# Patient Record
Sex: Female | Born: 1990 | Hispanic: No | Marital: Single | Smoking: Current every day smoker
Health system: Southern US, Community
[De-identification: ages and names within clinical notes are randomized; demographics above are authoritative.]

## PROBLEM LIST (undated history)

## (undated) ENCOUNTER — Inpatient Hospital Stay (HOSPITAL_COMMUNITY): Payer: Self-pay

## (undated) DIAGNOSIS — R011 Cardiac murmur, unspecified: Secondary | ICD-10-CM

## (undated) DIAGNOSIS — A599 Trichomoniasis, unspecified: Secondary | ICD-10-CM

## (undated) DIAGNOSIS — Z8619 Personal history of other infectious and parasitic diseases: Secondary | ICD-10-CM

## (undated) DIAGNOSIS — A539 Syphilis, unspecified: Secondary | ICD-10-CM

## (undated) DIAGNOSIS — R51 Headache: Secondary | ICD-10-CM

## (undated) HISTORY — PX: WISDOM TOOTH EXTRACTION: SHX21

## (undated) HISTORY — PX: ROOT CANAL: SHX2363

## (undated) HISTORY — DX: Personal history of other infectious and parasitic diseases: Z86.19

---
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
## 2012-01-16 ENCOUNTER — Emergency Department (HOSPITAL_COMMUNITY): Payer: Medicaid Other

## 2012-01-16 ENCOUNTER — Emergency Department (HOSPITAL_COMMUNITY)
Admission: EM | Admit: 2012-01-16 | Discharge: 2012-01-16 | Disposition: A | Discharge status: Home / Self Care | Payer: Medicaid Other | Attending: Emergency Medicine | Admitting: Emergency Medicine

## 2012-01-16 ENCOUNTER — Encounter (HOSPITAL_COMMUNITY): Payer: Self-pay | Admitting: Emergency Medicine

## 2012-01-16 DIAGNOSIS — M79609 Pain in unspecified limb: Secondary | ICD-10-CM | POA: Insufficient documentation

## 2012-01-16 DIAGNOSIS — L600 Ingrowing nail: Secondary | ICD-10-CM | POA: Insufficient documentation

## 2012-01-16 DIAGNOSIS — F17200 Nicotine dependence, unspecified, uncomplicated: Secondary | ICD-10-CM | POA: Insufficient documentation

## 2012-01-16 MED ORDER — SULFAMETHOXAZOLE-TRIMETHOPRIM 800-160 MG PO TABS: 1.0000 | ORAL_TABLET | Freq: Two times a day (BID) | ORAL | Status: AC

## 2012-01-16 MED ORDER — LIDOCAINE HCL 2 % IJ SOLN
5.0000 mL | Freq: Once | INTRAMUSCULAR | Status: AC
  Administered 2012-01-16: 100 mg via INTRADERMAL
  Filled 2012-01-16 (×2): qty 1

## 2012-01-16 NOTE — ED Notes (Signed)
Onset of painful right great toe 3 days ago w/ yellowish drainage, last noc noticed same discomfort left great toe w/ minimal drainage. 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

## 2012-01-16 NOTE — ED Provider Notes (Addendum)
History     CSN: [id number]  Arrival date & time 01/16/12  1526   First MD Initiated Contact with Patient 01/16/12 1703      Chief Complaint  Patient presents with  . Toe Pain    bilateral ingrown toenails, both great toes    (Consider location/radiation/quality/duration/timing/severity/associated sxs/prior treatment) HPI Comments: Pt to ED w ingrown toenails bilaterally. Reports purulent dc from left great toe. Denies fevers, NS, chills. Pain severity 6/10.   Patient is a 21 y.o. female presenting with toe pain. The history is provided by the patient.  Toe Pain This is a new problem. Episode onset: 3 days ago. The problem occurs constantly. The problem has been gradually worsening. Pertinent negatives include no abdominal pain, arthralgias, change in bowel habit, chest pain, chills, congestion, coughing, diaphoresis, fever, joint swelling, myalgias, nausea, neck pain, numbness or rash. Exacerbated by: pressure. She has tried nothing for the symptoms.    Past Medical History  Diagnosis Date  . Headache   . Heart murmur   . Trichimoniasis   . H/O chlamydia infection     Past Surgical History  Procedure Date  . Root canal     Family History  Problem Relation Age of Onset  . Asthma Mother   . Hypertension Mother   . Asthma Father   . Heart disease Father   . Hypertension Father   . Hyperlipidemia Father   . Anesthesia problems Neg Hx   . Hypotension Neg Hx   . Malignant hyperthermia Neg Hx   . Pseudochol deficiency Neg Hx   . Other Neg Hx     History  Substance Use Topics  . Smoking status: Current Everyday Smoker -- 0.2 packs/day for 6 years    Types: Cigarettes  . Smokeless tobacco: Never Used  . Alcohol Use: No    OB History    Grav Para Term Preterm Abortions TAB SAB Ect Mult Living   1 1 1  0 0 0 0 0 0 1      Review of Systems  Constitutional: Negative for fever, chills and diaphoresis.  HENT: Negative for congestion and neck pain.   Respiratory:  Negative for cough.   Cardiovascular: Negative for chest pain.  Gastrointestinal: Negative for nausea, abdominal pain and change in bowel habit.  Musculoskeletal: Negative for myalgias, joint swelling and arthralgias.  Skin: Negative for rash.  Neurological: Negative for numbness.    Allergies  Review of patient's allergies indicates no known allergies.  Home Medications  No current outpatient prescriptions on file.  BP 114/64  Pulse 80  Temp 98.1 F (36.7 C) (Oral)  Resp 16  Ht 5\' 3"  (1.6 m)  SpO2 99%  Breastfeeding? No  Physical Exam  Nursing note and vitals reviewed. Constitutional: She is oriented to person, place, and time. She appears well-developed and well-nourished. No distress.  HENT:  Head: Normocephalic and atraumatic.  Eyes: Conjunctivae and EOM are normal.  Neck: Normal range of motion.  Pulmonary/Chest: Effort normal.  Musculoskeletal: Normal range of motion.  Neurological: She is alert and oriented to person, place, and time.  Skin: Skin is warm and dry. No rash noted. She is not diaphoretic.       Mild purulent drainage from left great toe at medial sd w erythema and tenderness. Ingrown toenails bilaterally w ttp.   Psychiatric: She has a normal mood and affect. Her behavior is normal.    ED Course  NAIL REMOVAL Date/Time: 01/16/2012 6:15 PM Performed by: Jaci Carrel Authorized by:  Major [place]ter[place] Consent: Verbal consent obtained. Risks and benefits: risks, benefits and alternatives were discussed Patient identity confirmed: verbally with patient Location: left foot Location details: left big toe Anesthesia: digital block Local anesthetic: lidocaine 2% without epinephrine Anesthetic total: 2 ml Patient sedated: no Preparation: skin prepped with alcohol Amount removed: partial Side: radial and ulnar Nail bed sutured: no Dressing: gauze roll and antibiotic ointment Patient tolerance: Patient tolerated the procedure well with no immediate  complications.   (including critical care time)  Labs Reviewed - No data to display Dg Toe Great Left  01/16/2012  *RADIOLOGY REPORT*  Clinical Data: Ingrown toenail.  Infection.  LEFT GREAT TOE  Comparison: None.  Findings: Imaged bones, joints and soft tissues appear normal.  IMPRESSION: Negative study.  Original Report Authenticated By: Bernadene Bell. Maricela Curet, M.D.     No diagnosis found.  Digital block performed and left ingrown nail excised.   MDM  Ingrown toe nails bilateral  Dc instructions:  Trim nail plate diagonally to relieve incarceration in soft tissue Follow-up w/warm soaks & antibiotics for active infection (bactrim given) Advised wide shoes & proper nail-cutting; may recur--> podiatrist f-u         Jaci Carrel, PA-C 01/16/12 1907  Jaci Carrel, PA-C 03/10/12 0158

## 2012-01-17 NOTE — ED Provider Notes (Signed)
Medical screening examination/treatment/procedure(s) were performed by non-physician practitioner and as supervising physician I was immediately available for consultation/collaboration.   Kim Harvey. Oletta Lamas, MD 01/17/12 4098

## 2012-02-24 ENCOUNTER — Telehealth (HOSPITAL_COMMUNITY): Payer: Self-pay | Admitting: *Deleted

## 2012-03-10 NOTE — ED Provider Notes (Signed)
Medical screening examination/treatment/procedure(s) were performed by non-physician practitioner and as supervising physician I was immediately available for consultation/collaboration.   Monae Topping Y. Rondall Radigan, MD 03/10/12 1136 

## 2012-04-06 ENCOUNTER — Encounter (HOSPITAL_COMMUNITY): Payer: Self-pay | Admitting: *Deleted

## 2012-04-06 ENCOUNTER — Inpatient Hospital Stay (HOSPITAL_COMMUNITY)
Admission: AD | Admit: 2012-04-06 | Discharge: 2012-04-06 | Discharge status: Against Medical Advice | Payer: Medicaid Other | Source: Ambulatory Visit | Attending: Obstetrics & Gynecology | Admitting: Obstetrics & Gynecology

## 2012-04-06 DIAGNOSIS — Z3201 Encounter for pregnancy test, result positive: Secondary | ICD-10-CM | POA: Insufficient documentation

## 2012-04-06 DIAGNOSIS — R109 Unspecified abdominal pain: Secondary | ICD-10-CM | POA: Insufficient documentation

## 2012-04-06 LAB — POCT PREGNANCY, URINE: Preg Test, Ur: POSITIVE — AB

## 2012-04-06 NOTE — OB Triage Note (Signed)
2nd call from lobby, not there

## 2012-04-06 NOTE — MAU Note (Signed)
3rd time- not in lobby 

## 2012-04-06 NOTE — MAU Note (Addendum)
Last preg July 2013, irreg periods, had period in Sept? Home UPT positive, here for preg test. Last pregnancy delivered by Dr. Shanna Cisco same this time

## 2012-06-01 NOTE — L&D Delivery Note (Signed)
Delivery Note At 10:33 AM a viable female was delivered via Vaginal, Spontaneous Delivery (Presentation: ; Occiput Anterior).  APGAR: 9, 9; weight .   Placenta status: , .  Cord: 3 vessels with the following complications: None.  Cord pH: not done  Anesthesia: Epidural  Episiotomy: None Lacerations:  Suture Repair: 2.0 Est. Blood Loss (mL):   Mom to postpartum.  Baby to nursery-stable.  Lundon Rosier A 12/03/2012, 11:16 AM

## 2012-06-06 LAB — OB RESULTS CONSOLE RUBELLA ANTIBODY, IGM: Rubella: IMMUNE

## 2012-06-06 LAB — OB RESULTS CONSOLE GBS: GBS: NEGATIVE

## 2012-06-06 LAB — OB RESULTS CONSOLE ABO/RH: RH Type: POSITIVE

## 2012-06-06 LAB — OB RESULTS CONSOLE HIV ANTIBODY (ROUTINE TESTING): HIV: NONREACTIVE

## 2012-06-06 LAB — OB RESULTS CONSOLE GC/CHLAMYDIA: Chlamydia: NEGATIVE

## 2012-06-20 ENCOUNTER — Encounter (HOSPITAL_COMMUNITY): Payer: Self-pay | Admitting: *Deleted

## 2012-06-20 ENCOUNTER — Inpatient Hospital Stay (HOSPITAL_COMMUNITY)
Admission: AD | Admit: 2012-06-20 | Discharge: 2012-06-20 | Disposition: A | Discharge status: Home / Self Care | Payer: Medicaid Other | Source: Ambulatory Visit | Attending: Obstetrics | Admitting: Obstetrics

## 2012-06-20 DIAGNOSIS — R109 Unspecified abdominal pain: Secondary | ICD-10-CM | POA: Insufficient documentation

## 2012-06-20 DIAGNOSIS — A088 Other specified intestinal infections: Secondary | ICD-10-CM

## 2012-06-20 DIAGNOSIS — O210 Mild hyperemesis gravidarum: Secondary | ICD-10-CM | POA: Insufficient documentation

## 2012-06-20 DIAGNOSIS — A084 Viral intestinal infection, unspecified: Secondary | ICD-10-CM

## 2012-06-20 LAB — URINALYSIS, ROUTINE W REFLEX MICROSCOPIC
Nitrite: NEGATIVE
Protein, ur: NEGATIVE mg/dL
Specific Gravity, Urine: >1.03 — ABNORMAL HIGH (ref 1.005–1.030)
Urobilinogen, UA: 0.2 mg/dL (ref 0.0–1.0)

## 2012-06-20 LAB — URINE MICROSCOPIC-ADD ON

## 2012-06-20 MED ORDER — PROMETHAZINE HCL 25 MG/ML IJ SOLN
12.5000 mg | Freq: Once | INTRAMUSCULAR | Status: AC
  Administered 2012-06-20: 12.5 mg via INTRAVENOUS
  Filled 2012-06-20: qty 1

## 2012-06-20 MED ORDER — PROMETHAZINE HCL 25 MG PO TABS: 25.0000 mg | ORAL_TABLET | Freq: Four times a day (QID) | ORAL | Status: DC | PRN

## 2012-06-20 MED ORDER — LACTATED RINGERS IV SOLN
INTRAVENOUS | Status: DC
  Administered 2012-06-20: 22:00:00 via INTRAVENOUS

## 2012-06-20 NOTE — MAU Provider Note (Signed)
History     CSN: [id number]  Arrival date and time: 06/20/12 2016   First Provider Initiated Contact with Patient 06/20/12 2124      Chief Complaint  Patient presents with  . Morning Sickness   HPI This is a 22 y.o. female at [redacted]w[redacted]d who presents with c/o vomiting all day. States has some stomach pains that come and go, all over stomach. Denies fever or diarrhea.   RN Note: Patient complains of stomach pains since this morning. Vomiting on-going all day. Has not been able to keep anything down.       OB History    Grav Para Term Preterm Abortions TAB SAB Ect Mult Living   2 1 1  0 0 0 0 0 0 1      Past Medical History  Diagnosis Date  . Headache   . Heart murmur   . Trichimoniasis   . H/O chlamydia infection     Past Surgical History  Procedure Date  . Root canal     Family History  Problem Relation Age of Onset  . Asthma Mother   . Hypertension Mother   . Asthma Father   . Heart disease Father   . Hypertension Father   . Hyperlipidemia Father   . Anesthesia problems Neg Hx   . Hypotension Neg Hx   . Malignant hyperthermia Neg Hx   . Pseudochol deficiency Neg Hx   . Other Neg Hx     History  Substance Use Topics  . Smoking status: Current Every Day Smoker -- 0.2 packs/day for 6 years    Types: Cigarettes  . Smokeless tobacco: Never Used  . Alcohol Use: No    Allergies: No Known Allergies  Prescriptions prior to admission  Medication Sig Dispense Refill  . acetaminophen (TYLENOL) 500 MG tablet Take 1,000 mg by mouth every 6 (six) hours as needed. For pain      . Prenatal Vit-Fe Fumarate-FA (PRENATAL MULTIVITAMIN) TABS Take 1 tablet by mouth daily.        Review of Systems  Constitutional: Negative for fever, chills and malaise/fatigue.  Gastrointestinal: Positive for nausea, vomiting and abdominal pain. Negative for heartburn, diarrhea and constipation.  Genitourinary: Negative for dysuria.  Musculoskeletal: Negative for myalgias.    Neurological: Positive for weakness. Negative for dizziness and headaches.   Physical Exam   Blood pressure 97/55, pulse 107, temperature 98.9 F (37.2 C), temperature source Oral, resp. rate 18, last menstrual period 02/14/2012, unknown if currently breastfeeding.  Physical Exam  Constitutional: She is oriented to person, place, and time. She appears well-developed. No distress.  HENT:  Head: Normocephalic.  Cardiovascular: Normal rate.   Respiratory: Effort normal.  GI: Soft. She exhibits no distension and no mass. There is tenderness (diffuse, mild, no focal tenderness). There is no rebound and no guarding.  Musculoskeletal: Normal range of motion.  Neurological: She is alert and oriented to person, place, and time.  Skin: Skin is warm and dry. She is not diaphoretic.  Psychiatric: She has a normal mood and affect.   FHR 150s  MAU Course  Procedures  MDM IV hydration of LR given with dose of Phenergan IV. Patient states she feels much better after this. Would like Rx Phenergan for home use.   Assessment and Plan  A:  SIUP at [redacted]w[redacted]d       Probable viral gastroenteritis  P:  Discharge home      Rx Phenergan given      Slow progression of diet  as tolerated      Followup with prenatal care  [facility] 06/20/2012, 9:31 PM

## 2012-06-20 NOTE — MAU Note (Signed)
Patient complains of stomach pains since this morning. Vomiting on-going all day. Has not been able to keep anything down.

## 2012-06-21 ENCOUNTER — Encounter (HOSPITAL_COMMUNITY): Payer: Self-pay | Admitting: Advanced Practice Midwife

## 2012-06-21 NOTE — MAU Provider Note (Signed)
Attestation of Attending Supervision of Advanced Practitioner (CNM/NP): Evaluation and management procedures were performed by the Advanced Practitioner under my supervision and collaboration.  I have reviewed the Advanced Practitioner's note and chart, and I agree with the management and plan.  HARRAWAY-SMITH, Maygan Koeller 6:34 AM

## 2012-06-22 LAB — URINE CULTURE
Colony Count: NO GROWTH
Culture: NO GROWTH

## 2012-10-05 ENCOUNTER — Inpatient Hospital Stay (HOSPITAL_COMMUNITY)
Admission: AD | Admit: 2012-10-05 | Discharge: 2012-10-05 | Disposition: A | Discharge status: Home / Self Care | Payer: Medicaid Other | Source: Ambulatory Visit | Attending: Obstetrics | Admitting: Obstetrics

## 2012-10-05 ENCOUNTER — Encounter (HOSPITAL_COMMUNITY): Payer: Self-pay

## 2012-10-05 DIAGNOSIS — R109 Unspecified abdominal pain: Secondary | ICD-10-CM

## 2012-10-05 DIAGNOSIS — N859 Noninflammatory disorder of uterus, unspecified: Secondary | ICD-10-CM

## 2012-10-05 DIAGNOSIS — N949 Unspecified condition associated with female genital organs and menstrual cycle: Secondary | ICD-10-CM

## 2012-10-05 DIAGNOSIS — O99891 Other specified diseases and conditions complicating pregnancy: Secondary | ICD-10-CM

## 2012-10-05 LAB — URINALYSIS, ROUTINE W REFLEX MICROSCOPIC
Bilirubin Urine: NEGATIVE
Nitrite: NEGATIVE
Protein, ur: NEGATIVE mg/dL
Specific Gravity, Urine: 1.01 (ref 1.005–1.030)
Urobilinogen, UA: 0.2 mg/dL (ref 0.0–1.0)

## 2012-10-05 MED ORDER — TERBUTALINE SULFATE 1 MG/ML IJ SOLN
0.2500 mg | Freq: Once | INTRAMUSCULAR | Status: AC
  Administered 2012-10-05: 0.25 mg via SUBCUTANEOUS
  Filled 2012-10-05: qty 1

## 2012-10-05 NOTE — MAU Provider Note (Signed)
History     CSN: [id number]  Arrival date and time: 10/05/12 1308   First Provider Initiated Contact with Patient 10/05/12 [phone]      Chief Complaint  Patient presents with  . Abdominal Pain   HPI This is a 22 y.o. female at [redacted]w[redacted]d who presents with c/o sharp pain in pubic bone for 2 months. States was up playing cards and it hurt worse.  Has some pelvic cramping also but mainly hurts in pubic bone, especially when walking.  RN Note: Patient states she has been having sharp pain in the lower abdomen and vagina for the last 2 months. The pain has gotten worse tonight. Denies vaginal bleeding and leaking of fluid. Good fetal movement.       OB History   Grav Para Term Preterm Abortions TAB SAB Ect Mult Living   2 1 1  0 0 0 0 0 0 1      Past Medical History  Diagnosis Date  . Headache   . Heart murmur   . Trichimoniasis   . H/O chlamydia infection     Past Surgical History  Procedure Laterality Date  . Root canal      Family History  Problem Relation Age of Onset  . Asthma Mother   . Hypertension Mother   . Asthma Father   . Heart disease Father   . Hypertension Father   . Hyperlipidemia Father   . Anesthesia problems Neg Hx   . Hypotension Neg Hx   . Malignant hyperthermia Neg Hx   . Pseudochol deficiency Neg Hx   . Other Neg Hx     History  Substance Use Topics  . Smoking status: Current Every Day Smoker -- 0.25 packs/day for 6 years    Types: Cigarettes  . Smokeless tobacco: Never Used  . Alcohol Use: No    Allergies: No Known Allergies  Prescriptions prior to admission  Medication Sig Dispense Refill  . acetaminophen (TYLENOL) 500 MG tablet Take 1,000 mg by mouth every 6 (six) hours as needed. For pain      . Prenatal Vit-Fe Fumarate-FA (PRENATAL MULTIVITAMIN) TABS Take 1 tablet by mouth daily.      . promethazine (PHENERGAN) 25 MG tablet Take 1 tablet (25 mg total) by mouth every 6 (six) hours as needed for nausea.  30 tablet  0    Review of  Systems  Constitutional: Negative for fever and malaise/fatigue.  Gastrointestinal: Negative for nausea, vomiting, abdominal pain, diarrhea and constipation.  Genitourinary:       Pain over suprapubic area  Neurological: Negative for weakness and headaches.   Physical Exam   Blood pressure 122/51, pulse 105, temperature 98.2 F (36.8 C), temperature source Oral, resp. rate 20, height 5\' 4"  (1.626 m), weight 158 lb (71.668 kg), last menstrual period 02/14/2012, SpO2 98.00%.  Physical Exam  Constitutional: She is oriented to person, place, and time. She appears well-developed and well-nourished. No distress.  Cardiovascular: Normal rate.   Respiratory: Effort normal.  GI: Soft. She exhibits no distension and no mass. There is tenderness (over suprapubic bone). There is no rebound and no guarding.  Genitourinary: Vagina normal and uterus normal. No vaginal discharge found.  Cervix long and closed  Musculoskeletal: Normal range of motion.  Neurological: She is alert and oriented to person, place, and time.  Skin: Skin is warm and dry.  Psychiatric: She has a normal mood and affect.   Fetal heart rate reactive Uterine irritability MAU Course  Procedures  MDM  Gave one dose of Terbutaline with resolution of some of her pain and irritability. Still has some pain in pubic bone when moving.  Assessment and Plan  A:  SIUP at [redacted]w[redacted]d      Uterine irritability, resolved      Symphysis pubis pain  P;  Discharge home       PTL precautions       Move slowly, comfort measures for SP pain       Follow up with Dr Raynelle Jan 10/05/2012, 3:38 AM

## 2012-10-05 NOTE — MAU Note (Signed)
Patient states she has been having sharp pain in the lower abdomen and vagina for the last 2 months. The pain has gotten worse tonight. Denies vaginal bleeding and leaking of fluid. Good fetal movement.

## 2012-11-16 ENCOUNTER — Encounter (HOSPITAL_COMMUNITY): Payer: Self-pay

## 2012-11-16 ENCOUNTER — Inpatient Hospital Stay (HOSPITAL_COMMUNITY)
Admission: AD | Admit: 2012-11-16 | Discharge: 2012-11-16 | Disposition: A | Discharge status: Home / Self Care | Payer: Medicaid Other | Source: Ambulatory Visit | Attending: Obstetrics | Admitting: Obstetrics

## 2012-11-16 DIAGNOSIS — O479 False labor, unspecified: Secondary | ICD-10-CM | POA: Insufficient documentation

## 2012-11-16 DIAGNOSIS — O212 Late vomiting of pregnancy: Secondary | ICD-10-CM | POA: Insufficient documentation

## 2012-11-16 DIAGNOSIS — R109 Unspecified abdominal pain: Secondary | ICD-10-CM | POA: Insufficient documentation

## 2012-11-16 LAB — URINALYSIS, ROUTINE W REFLEX MICROSCOPIC
Bilirubin Urine: NEGATIVE
Glucose, UA: NEGATIVE mg/dL
Hgb urine dipstick: NEGATIVE
Specific Gravity, Urine: 1.01 (ref 1.005–1.030)

## 2012-11-16 LAB — URINE MICROSCOPIC-ADD ON

## 2012-11-16 NOTE — MAU Note (Signed)
Pt states some abdominal pain and cramping since 11pm. Had some nausea and threw up x2. Denies vaginal bleeding and LOF. States good FM.

## 2012-11-16 NOTE — MAU Note (Signed)
Contractions, ?leaking fluid since 1700

## 2012-11-16 NOTE — MAU Note (Signed)
Pt presents with report of abd cramping, vomiting x 2 hours. Also reports increased fetal movement.

## 2012-11-17 LAB — URINE CULTURE: Culture: NO GROWTH

## 2012-11-18 ENCOUNTER — Emergency Department (HOSPITAL_COMMUNITY)
Admission: EM | Admit: 2012-11-18 | Discharge: 2012-11-18 | Disposition: A | Discharge status: Home / Self Care | Payer: No Typology Code available for payment source | Attending: Emergency Medicine | Admitting: Emergency Medicine

## 2012-11-18 ENCOUNTER — Encounter (HOSPITAL_COMMUNITY): Payer: Self-pay | Admitting: Emergency Medicine

## 2012-11-18 DIAGNOSIS — IMO0002 Reserved for concepts with insufficient information to code with codable children: Secondary | ICD-10-CM | POA: Insufficient documentation

## 2012-11-18 DIAGNOSIS — S3981XA Other specified injuries of abdomen, initial encounter: Secondary | ICD-10-CM | POA: Insufficient documentation

## 2012-11-18 DIAGNOSIS — Y9389 Activity, other specified: Secondary | ICD-10-CM | POA: Insufficient documentation

## 2012-11-18 DIAGNOSIS — Z8679 Personal history of other diseases of the circulatory system: Secondary | ICD-10-CM | POA: Insufficient documentation

## 2012-11-18 DIAGNOSIS — O99891 Other specified diseases and conditions complicating pregnancy: Secondary | ICD-10-CM | POA: Insufficient documentation

## 2012-11-18 DIAGNOSIS — M545 Low back pain: Secondary | ICD-10-CM

## 2012-11-18 DIAGNOSIS — Y92410 Unspecified street and highway as the place of occurrence of the external cause: Secondary | ICD-10-CM | POA: Insufficient documentation

## 2012-11-18 DIAGNOSIS — Z3493 Encounter for supervision of normal pregnancy, unspecified, third trimester: Secondary | ICD-10-CM

## 2012-11-18 DIAGNOSIS — Z8619 Personal history of other infectious and parasitic diseases: Secondary | ICD-10-CM | POA: Insufficient documentation

## 2012-11-18 DIAGNOSIS — O99330 Smoking (tobacco) complicating pregnancy, unspecified trimester: Secondary | ICD-10-CM | POA: Insufficient documentation

## 2012-11-18 LAB — CBC WITH DIFFERENTIAL/PLATELET
Eosinophils Relative: 1 % (ref 0–5)
Hemoglobin: 10.3 g/dL — ABNORMAL LOW (ref 12.0–15.0)
Lymphocytes Relative: 14 % (ref 12–46)
Lymphs Abs: 1.8 10*3/uL (ref 0.7–4.0)
MCV: 95.2 fL (ref 78.0–100.0)
Monocytes Relative: 8 % (ref 3–12)
Platelets: 247 10*3/uL (ref 150–400)
RBC: 3.1 MIL/uL — ABNORMAL LOW (ref 3.87–5.11)
WBC: 12.8 10*3/uL — ABNORMAL HIGH (ref 4.0–10.5)

## 2012-11-18 LAB — URINALYSIS, ROUTINE W REFLEX MICROSCOPIC
Glucose, UA: NEGATIVE mg/dL
Hgb urine dipstick: NEGATIVE
Specific Gravity, Urine: 1.024 (ref 1.005–1.030)
Urobilinogen, UA: 1 mg/dL (ref 0.0–1.0)
pH: 6.5 (ref 5.0–8.0)

## 2012-11-18 LAB — CG4 I-STAT (LACTIC ACID): Lactic Acid, Venous: 0.96 mmol/L (ref 0.5–2.2)

## 2012-11-18 LAB — URINE MICROSCOPIC-ADD ON
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
## 2012-11-18 NOTE — Progress Notes (Signed)
At pt bedside. Pt seems calm and in no pain or discomfort.

## 2012-11-18 NOTE — ED Notes (Signed)
Results of lactic acid shown to primary nurse

## 2012-11-18 NOTE — Progress Notes (Signed)
Discussed with pt signs of labor and fetal kick counts. Pt instructed to return to [facility] for any concerns regarding pregnancy. Pt verbalized understanding.
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
## 2012-11-19 LAB — URINE CULTURE: Colony Count: 4000

## 2012-11-29 ENCOUNTER — Telehealth (HOSPITAL_COMMUNITY): Payer: Self-pay | Admitting: *Deleted

## 2012-11-29 ENCOUNTER — Encounter (HOSPITAL_COMMUNITY): Payer: Self-pay | Admitting: *Deleted

## 2012-11-29 NOTE — Telephone Encounter (Signed)
Preadmission screen  

## 2012-12-03 ENCOUNTER — Inpatient Hospital Stay (HOSPITAL_COMMUNITY): Payer: Medicaid Other | Admitting: Anesthesiology

## 2012-12-03 ENCOUNTER — Inpatient Hospital Stay (HOSPITAL_COMMUNITY)
Admission: RE | Admit: 2012-12-03 | Payer: No Typology Code available for payment source | Source: Ambulatory Visit | Admitting: Obstetrics

## 2012-12-03 ENCOUNTER — Encounter (HOSPITAL_COMMUNITY): Payer: Self-pay | Admitting: *Deleted

## 2012-12-03 ENCOUNTER — Encounter (HOSPITAL_COMMUNITY): Payer: Self-pay | Admitting: Anesthesiology

## 2012-12-03 ENCOUNTER — Inpatient Hospital Stay (HOSPITAL_COMMUNITY)
Admission: AD | Admit: 2012-12-03 | Discharge: 2012-12-05 | DRG: 775 | Disposition: A | Discharge status: Home / Self Care | Payer: Medicaid Other | Source: Ambulatory Visit | Attending: Obstetrics | Admitting: Obstetrics

## 2012-12-03 LAB — CBC
MCH: 32.8 pg (ref 26.0–34.0)
MCHC: 34.8 g/dL (ref 30.0–36.0)
MCV: 94.4 fL (ref 78.0–100.0)
Platelets: 232 10*3/uL (ref 150–400)
RDW: 13.9 % (ref 11.5–15.5)

## 2012-12-03 MED ORDER — ACETAMINOPHEN 325 MG PO TABS: 650.0000 mg | ORAL_TABLET | ORAL | Status: DC | PRN

## 2012-12-03 MED ORDER — FLEET ENEMA 7-19 GM/118ML RE ENEM: 1.0000 | ENEMA | RECTAL | Status: DC | PRN

## 2012-12-03 MED ORDER — FENTANYL 2.5 MCG/ML BUPIVACAINE 1/10 % EPIDURAL INFUSION (WH - ANES)
INTRAMUSCULAR | Status: DC | PRN
  Administered 2012-12-03: 14 mL/h via EPIDURAL

## 2012-12-03 MED ORDER — BUTORPHANOL TARTRATE 1 MG/ML IJ SOLN: 1.0000 mg | INTRAMUSCULAR | Status: DC | PRN

## 2012-12-03 MED ORDER — OXYTOCIN 40 UNITS IN LACTATED RINGERS INFUSION - SIMPLE MED
62.5000 mL/h | INTRAVENOUS | Status: DC
  Filled 2012-12-03: qty 1000

## 2012-12-03 MED ORDER — PRENATAL MULTIVITAMIN CH: 1.0000 | ORAL_TABLET | Freq: Every day | ORAL | Status: DC

## 2012-12-03 MED ORDER — SENNOSIDES-DOCUSATE SODIUM 8.6-50 MG PO TABS
2.0000 | ORAL_TABLET | Freq: Every day | ORAL | Status: DC
  Administered 2012-12-03 – 2012-12-04 (×2): 2 via ORAL

## 2012-12-03 MED ORDER — PHENYLEPHRINE 40 MCG/ML (10ML) SYRINGE FOR IV PUSH (FOR BLOOD PRESSURE SUPPORT)
80.0000 ug | PREFILLED_SYRINGE | INTRAVENOUS | Status: DC | PRN
  Filled 2012-12-03: qty 2
  Filled 2012-12-03: qty 5

## 2012-12-03 MED ORDER — LANOLIN HYDROUS EX OINT: TOPICAL_OINTMENT | CUTANEOUS | Status: DC | PRN

## 2012-12-03 MED ORDER — DIPHENHYDRAMINE HCL 25 MG PO CAPS: 25.0000 mg | ORAL_CAPSULE | Freq: Four times a day (QID) | ORAL | Status: DC | PRN

## 2012-12-03 MED ORDER — ZOLPIDEM TARTRATE 5 MG PO TABS: 5.0000 mg | ORAL_TABLET | Freq: Every evening | ORAL | Status: DC | PRN

## 2012-12-03 MED ORDER — TERBUTALINE SULFATE 1 MG/ML IJ SOLN: 0.2500 mg | Freq: Once | INTRAMUSCULAR | Status: DC | PRN

## 2012-12-03 MED ORDER — SIMETHICONE 80 MG PO CHEW: 80.0000 mg | CHEWABLE_TABLET | ORAL | Status: DC | PRN

## 2012-12-03 MED ORDER — ONDANSETRON HCL 4 MG/2ML IJ SOLN
4.0000 mg | INTRAMUSCULAR | Status: DC | PRN
Start: 2012-12-03 — End: 2012-12-05

## 2012-12-03 MED ORDER — BENZOCAINE-MENTHOL 20-0.5 % EX AERO
1.0000 "application " | INHALATION_SPRAY | CUTANEOUS | Status: DC | PRN
  Filled 2012-12-03: qty 56

## 2012-12-03 MED ORDER — TETANUS-DIPHTH-ACELL PERTUSSIS 5-2.5-18.5 LF-MCG/0.5 IM SUSP
0.5000 mL | Freq: Once | INTRAMUSCULAR | Status: AC
  Administered 2012-12-04: 0.5 mL via INTRAMUSCULAR
  Filled 2012-12-03: qty 0.5

## 2012-12-03 MED ORDER — OXYTOCIN BOLUS FROM INFUSION
500.0000 mL | INTRAVENOUS | Status: DC
  Administered 2012-12-03: 500 mL via INTRAVENOUS

## 2012-12-03 MED ORDER — PHENYLEPHRINE 40 MCG/ML (10ML) SYRINGE FOR IV PUSH (FOR BLOOD PRESSURE SUPPORT)
80.0000 ug | PREFILLED_SYRINGE | INTRAVENOUS | Status: DC | PRN
  Administered 2012-12-03: 200 ug via INTRAVENOUS
  Filled 2012-12-03: qty 2

## 2012-12-03 MED ORDER — OXYCODONE-ACETAMINOPHEN 5-325 MG PO TABS
1.0000 | ORAL_TABLET | ORAL | Status: DC | PRN
  Administered 2012-12-03: 1 via ORAL
  Filled 2012-12-03: qty 1

## 2012-12-03 MED ORDER — FENTANYL 2.5 MCG/ML BUPIVACAINE 1/10 % EPIDURAL INFUSION (WH - ANES)
14.0000 mL/h | INTRAMUSCULAR | Status: DC | PRN
  Filled 2012-12-03: qty 125

## 2012-12-03 MED ORDER — NITROGLYCERIN 0.4 MG/SPRAY TL SOLN
Status: AC
  Filled 2012-12-03: qty 4.9

## 2012-12-03 MED ORDER — PRENATAL MULTIVITAMIN CH
1.0000 | ORAL_TABLET | Freq: Every day | ORAL | Status: DC
  Administered 2012-12-04 – 2012-12-05 (×2): 1 via ORAL
  Filled 2012-12-03 (×2): qty 1

## 2012-12-03 MED ORDER — LACTATED RINGERS IV SOLN: 500.0000 mL | INTRAVENOUS | Status: DC | PRN

## 2012-12-03 MED ORDER — FERROUS SULFATE 325 (65 FE) MG PO TABS
325.0000 mg | ORAL_TABLET | Freq: Two times a day (BID) | ORAL | Status: DC
  Administered 2012-12-03 – 2012-12-05 (×4): 325 mg via ORAL
  Filled 2012-12-03 (×4): qty 1

## 2012-12-03 MED ORDER — EPHEDRINE 5 MG/ML INJ
10.0000 mg | INTRAVENOUS | Status: DC | PRN
  Filled 2012-12-03: qty 4
  Filled 2012-12-03: qty 2

## 2012-12-03 MED ORDER — LACTATED RINGERS IV SOLN
500.0000 mL | Freq: Once | INTRAVENOUS | Status: AC
  Administered 2012-12-03: 500 mL via INTRAVENOUS

## 2012-12-03 MED ORDER — METHYLERGONOVINE MALEATE 0.2 MG/ML IJ SOLN
INTRAMUSCULAR | Status: AC
  Administered 2012-12-03: 0.2 mg
  Filled 2012-12-03: qty 1

## 2012-12-03 MED ORDER — WITCH HAZEL-GLYCERIN EX PADS: 1.0000 "application " | MEDICATED_PAD | CUTANEOUS | Status: DC | PRN

## 2012-12-03 MED ORDER — LACTATED RINGERS IV SOLN
INTRAVENOUS | Status: DC
  Administered 2012-12-03: 02:00:00 via INTRAVENOUS

## 2012-12-03 MED ORDER — IBUPROFEN 600 MG PO TABS
600.0000 mg | ORAL_TABLET | Freq: Four times a day (QID) | ORAL | Status: DC
  Administered 2012-12-03 – 2012-12-05 (×7): 600 mg via ORAL
  Filled 2012-12-03 (×7): qty 1

## 2012-12-03 MED ORDER — IBUPROFEN 600 MG PO TABS
600.0000 mg | ORAL_TABLET | Freq: Four times a day (QID) | ORAL | Status: DC | PRN
  Administered 2012-12-03: 600 mg via ORAL
  Filled 2012-12-03: qty 1

## 2012-12-03 MED ORDER — DIBUCAINE 1 % RE OINT: 1.0000 "application " | TOPICAL_OINTMENT | RECTAL | Status: DC | PRN

## 2012-12-03 MED ORDER — ONDANSETRON HCL 4 MG/2ML IJ SOLN: 4.0000 mg | Freq: Four times a day (QID) | INTRAMUSCULAR | Status: DC | PRN

## 2012-12-03 MED ORDER — LIDOCAINE HCL (PF) 1 % IJ SOLN
30.0000 mL | INTRAMUSCULAR | Status: DC | PRN
  Filled 2012-12-03 (×2): qty 30

## 2012-12-03 MED ORDER — ONDANSETRON HCL 4 MG PO TABS
4.0000 mg | ORAL_TABLET | ORAL | Status: DC | PRN
  Administered 2012-12-04 (×2): 4 mg via ORAL
  Filled 2012-12-03 (×3): qty 1

## 2012-12-03 MED ORDER — EPHEDRINE 5 MG/ML INJ
10.0000 mg | INTRAVENOUS | Status: DC | PRN
  Filled 2012-12-03: qty 2

## 2012-12-03 MED ORDER — LIDOCAINE HCL (PF) 1 % IJ SOLN
INTRAMUSCULAR | Status: DC | PRN
  Administered 2012-12-03: 3 mL
  Administered 2012-12-03: 4 mL

## 2012-12-03 MED ORDER — ERYTHROMYCIN 5 MG/GM OP OINT
TOPICAL_OINTMENT | OPHTHALMIC | Status: AC
  Filled 2012-12-03: qty 1

## 2012-12-03 MED ORDER — DIPHENHYDRAMINE HCL 50 MG/ML IJ SOLN: 12.5000 mg | INTRAMUSCULAR | Status: DC | PRN

## 2012-12-03 MED ORDER — CITRIC ACID-SODIUM CITRATE 334-500 MG/5ML PO SOLN: 30.0000 mL | ORAL | Status: DC | PRN

## 2012-12-03 MED ORDER — OXYTOCIN 40 UNITS IN LACTATED RINGERS INFUSION - SIMPLE MED
1.0000 m[IU]/min | INTRAVENOUS | Status: DC
  Administered 2012-12-03: 2 m[IU]/min via INTRAVENOUS

## 2012-12-03 MED ORDER — OXYCODONE-ACETAMINOPHEN 5-325 MG PO TABS
1.0000 | ORAL_TABLET | ORAL | Status: DC | PRN
  Administered 2012-12-04 (×3): 1 via ORAL
  Filled 2012-12-03 (×5): qty 1

## 2012-12-03 NOTE — Progress Notes (Signed)
Patient ID: Kim Harvey, female   DOB: 11/30/1990, 22 y.o.   MRN: [id number]

## 2012-12-03 NOTE — Anesthesia Procedure Notes (Signed)
Epidural Patient location during procedure: OB Start time: 12/03/2012 6:25 AM End time: 12/03/2012 6:30 AM  Staffing Anesthesiologist: Lewie Loron R Performed by: anesthesiologist   Preanesthetic Checklist Completed: patient identified, pre-op evaluation, timeout performed, IV checked, risks and benefits discussed and monitors and equipment checked  Epidural Patient position: sitting Prep: site prepped and draped and DuraPrep Patient monitoring: heart rate, blood pressure and continuous pulse ox Approach: midline Injection technique: LOR air and LOR saline  Needle:  Needle type: Tuohy  Needle gauge: 17 G Needle length: 9 cm Needle insertion depth: 5 cm Catheter type: closed end flexible Catheter size: 19 Gauge Catheter at skin depth: 11 cm Test dose: negative  Assessment Sensory level: T8 Events: blood not aspirated, injection not painful, no injection resistance, negative IV test and no paresthesia  Additional Notes Reason for block:procedure for pain

## 2012-12-03 NOTE — MAU Provider Note (Signed)
S: 22 y.o. G2P1001 @[redacted]w[redacted]d  presents to MAU for r/o rupture of membranes.  She reports some leakage of mucousy fluid after urinating several times today but reports her underwear have been dry in between with no need to wear a pad. She reports good fetal movement, denies vaginal bleeding, vaginal itching/burning, urinary symptoms, h/a, dizziness, n/v, or fever/chills.    Pt has IOL scheduled this morning at 7 am.  O: BP 132/71  Pulse 100  Temp(Src) 97.5 F (36.4 C)  Resp 20  Ht 5\' 5"  (1.651 m)  Wt 74.571 kg (164 lb 6.4 oz)  BMI 27.36 kg/m2  SpO2 99%  LMP 02/14/2012  Dilation: 4 Effacement (%): 50 Station: -2;Ballotable Presentation: Vertex Exam by:: Leftwich-Kirby,CNM  Ferning negative, Pooling negative on speculum exam, large amount frothy white discharge  Wet prep with few clue cells, neg trich, neg yeast   A: Intact membranes  P: RN to call Dr Gaynell Face Admit to L&D for IOL per Dr Calla Kicks Certified Nurse-Midwife

## 2012-12-03 NOTE — Progress Notes (Signed)
Dr Willa Frater - called for ntg - placenta still intact

## 2012-12-03 NOTE — Anesthesia Preprocedure Evaluation (Signed)
Anesthesia Evaluation  Patient identified by MRN, date of birth, ID band Patient awake    Reviewed: Allergy & Precautions, H&P , NPO status , Patient's Chart, lab work & pertinent test results  Airway Mallampati: I TM Distance: >3 FB Neck ROM: full    Dental no notable dental hx.    Pulmonary neg pulmonary ROS,    Pulmonary exam normal       Cardiovascular + Valvular Problems/Murmurs     Neuro/Psych  Headaches, negative psych ROS   GI/Hepatic negative GI ROS, Neg liver ROS,   Endo/Other  negative endocrine ROS  Renal/GU negative Renal ROS  negative genitourinary   Musculoskeletal negative musculoskeletal ROS (+)   Abdominal Normal abdominal exam  (+)   Peds negative pediatric ROS (+)  Hematology negative hematology ROS (+)   Anesthesia Other Findings   Reproductive/Obstetrics (+) Pregnancy                           Anesthesia Physical  Anesthesia Plan  ASA: II  Anesthesia Plan: Epidural   Post-op Pain Management:    Induction:   Airway Management Planned:   Additional Equipment:   Intra-op Plan:   Post-operative Plan:   Informed Consent: I have reviewed the patients History and Physical, chart, labs and discussed the procedure including the risks, benefits and alternatives for the proposed anesthesia with the patient or authorized representative who has indicated his/her understanding and acceptance.     Plan Discussed with:   Anesthesia Plan Comments:         Anesthesia Quick Evaluation

## 2012-12-03 NOTE — Progress Notes (Signed)
Kim Land crna - called b/p low 80/20;s - new order to give of phenylephrine

## 2012-12-03 NOTE — H&P (Signed)
This is Dr. Francoise Ceo dictating the history and physical on  Kim Harvey she's a 22 year old gravida 2 para 100 at 40 weeks and 1 day in labor EDC 74 negative GBS she is 4-5 cm 70% vertex -2 amniotomy performed the fluids clear tracing reactive she has her epidural Past medical history negative Past surgical history negative Social history negative System review noncontributory Physical exam well-developed female in labor HEENT negative Lungs clear to P&A Breasts negative Abdomen term Pelvic as described above Extremities negative

## 2012-12-03 NOTE — MAU Note (Addendum)
At 0600 I woke up and my stomach was hurting. Everytime after I pee water is running down my legs. Cramping all day. Pt states baby has not moved as much as usual today

## 2012-12-04 LAB — CBC
HCT: 22.9 % — ABNORMAL LOW (ref 36.0–46.0)
Hemoglobin: 7.9 g/dL — ABNORMAL LOW (ref 12.0–15.0)
MCV: 94.2 fL (ref 78.0–100.0)
RBC: 2.43 MIL/uL — ABNORMAL LOW (ref 3.87–5.11)
WBC: 17.3 10*3/uL — ABNORMAL HIGH (ref 4.0–10.5)

## 2012-12-04 MED ORDER — PNEUMOCOCCAL VAC POLYVALENT 25 MCG/0.5ML IJ INJ
0.5000 mL | INJECTION | INTRAMUSCULAR | Status: AC
  Administered 2012-12-05: 0.5 mL via INTRAMUSCULAR
  Filled 2012-12-04: qty 0.5

## 2012-12-04 NOTE — Progress Notes (Signed)
Patient ID: Kim Harvey, female   DOB: 1990-07-14, 22 y.o.   MRN: [id number] Postpartum day one Vital signs normal Fundus firm Legs negative Doing well and and

## 2012-12-05 NOTE — Progress Notes (Signed)
Ur chart review completed post discharge.  

## 2012-12-05 NOTE — Discharge Summary (Signed)
Obstetric Discharge Summary Reason for Admission: onset of labor Prenatal Procedures: none Intrapartum Procedures: spontaneous vaginal delivery Postpartum Procedures: none Complications-Operative and Postpartum: none Hemoglobin  Date Value Range Status  12/04/2012 7.9* 12.0 - 15.0 g/dL Final     REPEATED TO VERIFY     DELTA CHECK NOTED     HCT  Date Value Range Status  12/04/2012 22.9* 36.0 - 46.0 % Final    Physical Exam:  General: alert Lochia: appropriate Uterine Fundus: firm Incision: healing well DVT Evaluation: No evidence of DVT seen on physical exam.  Discharge Diagnoses: Term Pregnancy-delivered  Discharge Information: Date: 12/05/2012 Activity: pelvic rest Diet: routine Medications: Percocet Condition: stable Instructions: refer to practice specific booklet Discharge to: home Follow-up Information   Follow up with MARSHALL,BERNARD A, MD. Schedule an appointment as soon as possible for a visit in 6 weeks.   Contact information:   [address] [place] [address] [phone]       Newborn Data: Live born female  Birth Weight: 7 lb 6 oz (3345 g) APGAR: 9, 9  Home with mother.  MARSHALL,BERNARD A 12/05/2012, 6:40 AM

## 2013-05-09 ENCOUNTER — Encounter (HOSPITAL_COMMUNITY): Payer: Self-pay | Admitting: Emergency Medicine

## 2013-05-09 ENCOUNTER — Emergency Department (HOSPITAL_COMMUNITY)
Admission: EM | Admit: 2013-05-09 | Discharge: 2013-05-09 | Disposition: A | Discharge status: Home / Self Care | Payer: Medicaid Other | Attending: Emergency Medicine | Admitting: Emergency Medicine

## 2013-05-09 DIAGNOSIS — R011 Cardiac murmur, unspecified: Secondary | ICD-10-CM | POA: Insufficient documentation

## 2013-05-09 DIAGNOSIS — N390 Urinary tract infection, site not specified: Secondary | ICD-10-CM

## 2013-05-09 DIAGNOSIS — Z3202 Encounter for pregnancy test, result negative: Secondary | ICD-10-CM | POA: Insufficient documentation

## 2013-05-09 DIAGNOSIS — F17200 Nicotine dependence, unspecified, uncomplicated: Secondary | ICD-10-CM | POA: Insufficient documentation

## 2013-05-09 DIAGNOSIS — Z8619 Personal history of other infectious and parasitic diseases: Secondary | ICD-10-CM | POA: Insufficient documentation

## 2013-05-09 LAB — URINALYSIS, ROUTINE W REFLEX MICROSCOPIC
Bilirubin Urine: NEGATIVE
Glucose, UA: NEGATIVE mg/dL
Ketones, ur: NEGATIVE mg/dL
Nitrite: POSITIVE — AB
Protein, ur: NEGATIVE mg/dL
Specific Gravity, Urine: 1.021 (ref 1.005–1.030)
Urobilinogen, UA: 0.2 mg/dL (ref 0.0–1.0)
pH: 6.5 (ref 5.0–8.0)

## 2013-05-09 LAB — URINE MICROSCOPIC-ADD ON

## 2013-05-09 LAB — PREGNANCY, URINE: Preg Test, Ur: NEGATIVE

## 2013-05-09 MED ORDER — SULFAMETHOXAZOLE-TRIMETHOPRIM 800-160 MG PO TABS: 1.0000 | ORAL_TABLET | Freq: Two times a day (BID) | ORAL | Status: DC

## 2013-05-09 NOTE — ED Notes (Signed)
Pt from home c/o dysuria, urinary frequency, vaginal d/c x1 week. Pt denies hematuria, N/V/D, flank or back pain. Pt is A&O and in NAD

## 2013-05-11 LAB — URINE CULTURE: Colony Count: >=100000

## 2013-05-12 ENCOUNTER — Telehealth (HOSPITAL_COMMUNITY): Payer: Self-pay | Admitting: Emergency Medicine

## 2013-05-12 NOTE — ED Notes (Signed)
Post ED Visit - Positive Culture Follow-up  Culture report reviewed by antimicrobial stewardship pharmacist: []  Kim Harvey, Pharm.D., BCPS [x]  Kim Harvey, Pharm.D., BCPS []  Kim Harvey, Pharm.D., BCPS []  Kim Harvey, [address].D., BCPS, AAHIVP []  Kim Harvey, Pharm.D., BCPS, AAHIVP  Positive urine culture Treated with Sulfa-Trimeth, organism sensitive to the same and no further patient follow-up is required at this time.  Kim Harvey 05/12/2013, 11:16 AM

## 2013-05-17 NOTE — ED Provider Notes (Signed)
CSN: [id number]     Arrival date & time 05/09/13  1841 History   First MD Initiated Contact with Patient 05/09/13 1858     Chief Complaint  Patient presents with  . Dysuria  . Urinary Frequency  . Vaginal Discharge   (Consider location/radiation/quality/duration/timing/severity/associated sxs/prior Treatment) HPI  22 year old female with dysuria, increased urinary frequency. Symptom onset about a week ago. Persistent. No appreciable exacerbating relieving factors. Denies any abdominal, flank or back pain. No nausea vomiting. No fevers or chills. Mild vaginal discharge. No intervention prior to arrival.  Past Medical History  Diagnosis Date  . Headache(784.0)   . Heart murmur   . Trichimoniasis   . H/O chlamydia infection    Past Surgical History  Procedure Laterality Date  . Root canal    . Wisdom tooth extraction     Family History  Problem Relation Age of Onset  . Asthma Mother   . Hypertension Mother   . Asthma Father   . Heart disease Father   . Hypertension Father   . Hyperlipidemia Father   . Vision loss Father   . Anesthesia problems Neg Hx   . Hypotension Neg Hx   . Malignant hyperthermia Neg Hx   . Pseudochol deficiency Neg Hx   . Other Neg Hx    History  Substance Use Topics  . Smoking status: Current Every Day Smoker -- 0.50 packs/day for 6 years    Types: Cigarettes  . Smokeless tobacco: Never Used  . Alcohol Use: No   OB History   Grav Para Term Preterm Abortions TAB SAB Ect Mult Living   2 2 2  0 0 0 0 0 0 2     Review of Systems  All systems reviewed and negative, other than as noted in HPI.   Allergies  Review of patient's allergies indicates no known allergies.  Home Medications   Current Outpatient Rx  Name  Route  Sig  Dispense  Refill  . Glycerin-Hypromellose-PEG 400 (VISINE PURE TEARS OP)   Ophthalmic   Apply 2 drops to eye 2 (two) times daily as needed (dry eyes).         Marland Kitchen sulfamethoxazole-trimethoprim (SEPTRA DS) 800-160 MG  per tablet   Oral   Take 1 tablet by mouth every 12 (twelve) hours.   10 tablet   0    BP 116/66  Pulse 82  Temp(Src) 97.8 F (36.6 C) (Oral)  Resp 20  SpO2 99%  LMP 04/25/2013  Breastfeeding? No Physical Exam  Nursing note and vitals reviewed. Constitutional: She appears well-developed and well-nourished. No distress.  HENT:  Head: Normocephalic and atraumatic.  Eyes: Conjunctivae are normal. Right eye exhibits no discharge. Left eye exhibits no discharge.  Neck: Neck supple.  Cardiovascular: Normal rate, regular rhythm and normal heart sounds.  Exam reveals no gallop and no friction rub.   No murmur heard. Pulmonary/Chest: Effort normal and breath sounds normal. No respiratory distress.  Abdominal: Soft. She exhibits no distension. There is no tenderness.  Genitourinary:  No CVA tenderness  Musculoskeletal: She exhibits no edema and no tenderness.  Neurological: She is alert.  Skin: Skin is warm and dry.  Psychiatric: She has a normal mood and affect. Her behavior is normal. Thought content normal.    ED Course  Procedures (including critical care time) Labs Review Labs Reviewed  URINALYSIS, ROUTINE W REFLEX MICROSCOPIC - Abnormal; Notable for the following:    APPearance CLOUDY (*)    Hgb urine dipstick TRACE (*)  Nitrite POSITIVE (*)    Leukocytes, UA MODERATE (*)    All other components within normal limits  URINE MICROSCOPIC-ADD ON - Abnormal; Notable for the following:    Squamous Epithelial / LPF FEW (*)    Bacteria, UA MANY (*)    All other components within normal limits  URINE CULTURE  WET PREP, GENITAL  GC/CHLAMYDIA PROBE AMP  PREGNANCY, URINE   Imaging Review No results found.  EKG Interpretation   None       MDM   1. UTI (urinary tract infection)    21 year old female with symptoms and urinalysis consistent with UTI. Patient is afebrile and well appearing. Hemodynamically stable. Feel she is appropriate for outpatient treatment at this  time. Return precautions were discussed.    Raeford Razor, MD 05/17/13 [place] [place]

## 2013-07-26 ENCOUNTER — Encounter (HOSPITAL_COMMUNITY): Payer: Self-pay | Admitting: Emergency Medicine

## 2013-07-26 ENCOUNTER — Emergency Department (HOSPITAL_COMMUNITY)
Admission: EM | Admit: 2013-07-26 | Discharge: 2013-07-27 | Disposition: A | Discharge status: Home / Self Care | Payer: Medicaid Other | Attending: Emergency Medicine | Admitting: Emergency Medicine

## 2013-07-26 DIAGNOSIS — N72 Inflammatory disease of cervix uteri: Secondary | ICD-10-CM | POA: Insufficient documentation

## 2013-07-26 DIAGNOSIS — B9689 Other specified bacterial agents as the cause of diseases classified elsewhere: Secondary | ICD-10-CM | POA: Insufficient documentation

## 2013-07-26 DIAGNOSIS — N390 Urinary tract infection, site not specified: Secondary | ICD-10-CM | POA: Insufficient documentation

## 2013-07-26 DIAGNOSIS — R143 Flatulence: Secondary | ICD-10-CM

## 2013-07-26 DIAGNOSIS — F17200 Nicotine dependence, unspecified, uncomplicated: Secondary | ICD-10-CM | POA: Insufficient documentation

## 2013-07-26 DIAGNOSIS — N760 Acute vaginitis: Secondary | ICD-10-CM

## 2013-07-26 DIAGNOSIS — R142 Eructation: Secondary | ICD-10-CM | POA: Insufficient documentation

## 2013-07-26 DIAGNOSIS — Z3202 Encounter for pregnancy test, result negative: Secondary | ICD-10-CM | POA: Insufficient documentation

## 2013-07-26 DIAGNOSIS — R141 Gas pain: Secondary | ICD-10-CM | POA: Insufficient documentation

## 2013-07-26 DIAGNOSIS — R011 Cardiac murmur, unspecified: Secondary | ICD-10-CM | POA: Insufficient documentation

## 2013-07-26 DIAGNOSIS — A499 Bacterial infection, unspecified: Secondary | ICD-10-CM | POA: Insufficient documentation

## 2013-07-26 LAB — CBC WITH DIFFERENTIAL/PLATELET
Basophils Absolute: 0 10*3/uL (ref 0.0–0.1)
Basophils Relative: 1 % (ref 0–1)
EOS PCT: 1 % (ref 0–5)
Eosinophils Absolute: 0.1 10*3/uL (ref 0.0–0.7)
HEMATOCRIT: 34.8 % — AB (ref 36.0–46.0)
HEMOGLOBIN: 11.7 g/dL — AB (ref 12.0–15.0)
LYMPHS ABS: 2.4 10*3/uL (ref 0.7–4.0)
LYMPHS PCT: 32 % (ref 12–46)
MCH: 31.1 pg (ref 26.0–34.0)
MCHC: 33.6 g/dL (ref 30.0–36.0)
MCV: 92.6 fL (ref 78.0–100.0)
Monocytes Absolute: 0.4 10*3/uL (ref 0.1–1.0)
Monocytes Relative: 6 % (ref 3–12)
Neutro Abs: 4.5 10*3/uL (ref 1.7–7.7)
Neutrophils Relative %: 60 % (ref 43–77)
PLATELETS: 215 10*3/uL (ref 150–400)
RBC: 3.76 MIL/uL — AB (ref 3.87–5.11)
RDW: 12.9 % (ref 11.5–15.5)
WBC: 7.5 10*3/uL (ref 4.0–10.5)

## 2013-07-26 LAB — URINALYSIS, ROUTINE W REFLEX MICROSCOPIC
Bilirubin Urine: NEGATIVE
GLUCOSE, UA: NEGATIVE mg/dL
KETONES UR: NEGATIVE mg/dL
Nitrite: NEGATIVE
PROTEIN: NEGATIVE mg/dL
Specific Gravity, Urine: 1.024 (ref 1.005–1.030)
Urobilinogen, UA: 1 mg/dL (ref 0.0–1.0)
pH: 6.5 (ref 5.0–8.0)

## 2013-07-26 LAB — COMPREHENSIVE METABOLIC PANEL
ALK PHOS: 52 U/L (ref 39–117)
ALT: 9 U/L (ref 0–35)
AST: 16 U/L (ref 0–37)
Albumin: 3.7 g/dL (ref 3.5–5.2)
BUN: 9 mg/dL (ref 6–23)
CALCIUM: 9.1 mg/dL (ref 8.4–10.5)
CO2: 26 mEq/L (ref 19–32)
Chloride: 101 mEq/L (ref 96–112)
Creatinine, Ser: 0.66 mg/dL (ref 0.50–1.10)
GFR calc non Af Amer: >90 mL/min (ref >90)
GLUCOSE: 85 mg/dL (ref 70–99)
Potassium: 4.2 mEq/L (ref 3.7–5.3)
SODIUM: 138 meq/L (ref 137–147)
Total Bilirubin: 0.2 mg/dL — ABNORMAL LOW (ref 0.3–1.2)
Total Protein: 7.1 g/dL (ref 6.0–8.3)

## 2013-07-26 LAB — URINE MICROSCOPIC-ADD ON

## 2013-07-26 LAB — WET PREP, GENITAL
TRICH WET PREP: NONE SEEN
YEAST WET PREP: NONE SEEN

## 2013-07-26 LAB — POC URINE PREG, ED: Preg Test, Ur: NEGATIVE

## 2013-07-26 LAB — LIPASE, BLOOD: Lipase: 51 U/L (ref 11–59)

## 2013-07-26 MED ORDER — DOXYCYCLINE HYCLATE 100 MG PO CAPS: 100.0000 mg | ORAL_CAPSULE | Freq: Two times a day (BID) | ORAL | Status: DC

## 2013-07-26 MED ORDER — CEFTRIAXONE SODIUM 250 MG IJ SOLR
250.0000 mg | Freq: Once | INTRAMUSCULAR | Status: AC
Start: 2013-07-26 — End: 2013-07-26
  Administered 2013-07-26: 250 mg via INTRAMUSCULAR
  Filled 2013-07-26: qty 250

## 2013-07-26 MED ORDER — CEPHALEXIN 500 MG PO CAPS: 500.0000 mg | ORAL_CAPSULE | Freq: Four times a day (QID) | ORAL | Status: DC

## 2013-07-26 MED ORDER — AZITHROMYCIN 250 MG PO TABS
1000.0000 mg | ORAL_TABLET | Freq: Once | ORAL | Status: AC
  Administered 2013-07-26: 1000 mg via ORAL
  Filled 2013-07-26: qty 4

## 2013-07-26 MED ORDER — LIDOCAINE HCL 2 % IJ SOLN
INTRAMUSCULAR | Status: AC
  Filled 2013-07-26: qty 20

## 2013-07-26 NOTE — Discharge Instructions (Signed)
Take both antibiotics to completion. You were treated today for both gonorrhea and chlamydia. If these tests result positive, you will be contacted and are obligated to inform your partner.  Cervicitis Cervicitis is a soreness and swelling (inflammation) of the cervix. Your cervix is located at the bottom of your uterus. It opens up to the vagina. CAUSES   Sexually transmitted infections (STIs).   Allergic reaction.   Medicines or birth control devices that are put in the vagina.   Injury to the cervix.   Bacterial infections.  RISK FACTORS You are at greater risk if you:  Have unprotected sexual intercourse.  Have sexual intercourse with many partners.  Began sexual intercourse at an early age.  Have a history of STIs. SYMPTOMS  There may be no symptoms. If symptoms occur, they may include:   Grey, white, yellow, or bad-smelling vaginal discharge.   Pain or itching of the area outside the vagina.   Painful sexual intercourse.   Lower abdominal or lower back pain, especially during intercourse.   Frequent urination.   Abnormal vaginal bleeding between periods, after sexual intercourse, or after menopause.   Pressure or a heavy feeling in the pelvis.  DIAGNOSIS  Diagnosis is made after a pelvic exam. Other tests may include:   Examination of any discharge under a microscope (wet prep).   A Pap test.  TREATMENT  Treatment will depend on the cause of cervicitis. If it is caused by an STI, both you and your partner will need to be treated. Antibiotic medicines will be given.  HOME CARE INSTRUCTIONS   Do not have sexual intercourse until your health care provider says it is okay.   Do not have sexual intercourse until your partner has been treated, if your cervicitis is caused by an STI.   Take your antibiotics as directed. Finish them even if you start to feel better.  SEEK MEDICAL CARE IF:  Your symptoms come back.   You have a fever.  MAKE  SURE YOU:   Understand these instructions.  Will watch your condition.  Will get help right away if you are not doing well or get worse. Document Released: 05/18/2005 Document Revised: 01/18/2013 Document Reviewed: 11/09/2012 [facility] Patient Information 2014 [place], [place].  Urinary Tract Infection Urinary tract infections (UTIs) can develop anywhere along your urinary tract. Your urinary tract is your body's drainage system for removing wastes and extra water. Your urinary tract includes two kidneys, two ureters, a bladder, and a urethra. Your kidneys are a pair of bean-shaped organs. Each kidney is about the size of your fist. They are located below your ribs, one on each side of your spine. CAUSES Infections are caused by microbes, which are microscopic organisms, including fungi, viruses, and bacteria. These organisms are so small that they can only be seen through a microscope. Bacteria are the microbes that most commonly cause UTIs. SYMPTOMS  Symptoms of UTIs may vary by age and gender of the patient and by the location of the infection. Symptoms in young women typically include a frequent and intense urge to urinate and a painful, burning feeling in the bladder or urethra during urination. Older women and men are more likely to be tired, shaky, and weak and have muscle aches and abdominal pain. A fever may mean the infection is in your kidneys. Other symptoms of a kidney infection include pain in your back or sides below the ribs, nausea, and vomiting. DIAGNOSIS To diagnose a UTI, your caregiver will ask you  about your symptoms. Your caregiver also will ask to provide a urine sample. The urine sample will be tested for bacteria and white blood cells. White blood cells are made by your body to help fight infection. TREATMENT  Typically, UTIs can be treated with medication. Because most UTIs are caused by a bacterial infection, they usually can be treated with the use of antibiotics. The  choice of antibiotic and length of treatment depend on your symptoms and the type of bacteria causing your infection. HOME CARE INSTRUCTIONS  If you were prescribed antibiotics, take them exactly as your caregiver instructs you. Finish the medication even if you feel better after you have only taken some of the medication.  Drink enough water and fluids to keep your urine clear or pale yellow.  Avoid caffeine, tea, and carbonated beverages. They tend to irritate your bladder.  Empty your bladder often. Avoid holding urine for long periods of time.  Empty your bladder before and after sexual intercourse.  After a bowel movement, women should cleanse from front to back. Use each tissue only once. SEEK MEDICAL CARE IF:   You have back pain.  You develop a fever.  Your symptoms do not begin to resolve within 3 days. SEEK IMMEDIATE MEDICAL CARE IF:   You have severe back pain or lower abdominal pain.  You develop chills.  You have nausea or vomiting.  You have continued burning or discomfort with urination. MAKE SURE YOU:   Understand these instructions.  Will watch your condition.  Will get help right away if you are not doing well or get worse. Document Released: 02/25/2005 Document Revised: 11/17/2011 Document Reviewed: 06/26/2011 [facility] Patient Information 2014 [place], [place].  Sexually Transmitted Disease A sexually transmitted disease (STD) is a disease or infection that may be passed (transmitted) from person to person, usually during sexual activity. This may happen by way of saliva, semen, blood, vaginal mucus, or urine. Common STDs include:   Gonorrhea.   Chlamydia.   Syphilis.   HIV and AIDS.   Genital herpes.   Hepatitis B and C.   Trichomonas.   Human papillomavirus (HPV).   Pubic lice.   Scabies.  Mites.  Bacterial vaginosis. WHAT ARE CAUSES OF STDs? An STD may be caused by bacteria, a virus, or parasites. STDs are often  transmitted during sexual activity if one person is infected. However, they may also be transmitted through nonsexual means. STDs may be transmitted after:   Sexual intercourse with an infected person.   Sharing sex toys with an infected person.   Sharing needles with an infected person or using unclean piercing or tattoo needles.  Having intimate contact with the genitals, mouth, or rectal areas of an infected person.   Exposure to infected fluids during birth. WHAT ARE THE SIGNS AND SYMPTOMS OF STDs? Different STDs have different symptoms. Some people may not have any symptoms. If symptoms are present, they may include:   Painful or bloody urination.   Pain in the pelvis, abdomen, vagina, anus, throat, or eyes.   Skin rash, itching, irritation, growths, sores (lesions), ulcerations, or warts in the genital or anal area.  Abnormal vaginal discharge with or without bad odor.   Penile discharge in men.   Fever.   Pain or bleeding during sexual intercourse.   Swollen glands in the groin area.   Yellow skin and eyes (jaundice). This is seen with hepatitis.   Swollen testicles.  Infertility.  Sores and blisters in the mouth. HOW ARE STDs  DIAGNOSED? To make a diagnosis, your health care provider may:   Take a medical history.   Perform a physical exam.   Take a sample of any discharge for examination.  Swab the throat, cervix, opening to the penis, rectum, or vagina for testing.  Test a sample of your first morning urine.   Perform blood tests.   Perform a Pap smear, if this applies.   Perform a colposcopy.   Perform a laparoscopy.  HOW ARE STDs TREATED? Treatment depends on the STD. Some STDs may be treated but not cured.   Chlamydia, gonorrhea, trichomonas, and syphilis can be cured with antibiotics.   Genital herpes, hepatitis, and HIV can be treated, but not cured, with prescribed medicines. The medicines lessen symptoms.   Genital  warts from HPV can be treated with medicine or by freezing, burning (electrocautery), or surgery. Warts may come back.   HPV cannot be cured with medicine or surgery. However, abnormal areas may be removed from the cervix, vagina, or vulva.   If your diagnosis is confirmed, your recent sexual partners need treatment. This is true even if they are symptom-free or have a negative culture or evaluation. They should not have sex until their health care providers say it is OK. HOW CAN I REDUCE MY RISK OF GETTING AN STD?  Use latex condoms, dental dams, and water-soluble lubricants during sexual activity. Do not use petroleum jelly or oils.  Get vaccinated for HPV and hepatitis. If you have not received these vaccines in the past, talk to your health care provider about whether one or both might be right for you.   Avoid risky sex practices that can break the skin.  WHAT SHOULD I DO IF I THINK I HAVE AN STD?  See your health care provider.   Inform all sexual partners. They should be tested and treated for any STDs.  Do not have sex until your health care provider says it is OK. WHEN SHOULD I GET HELP? Seek immediate medical care if:  You develop severe abdominal pain.  You are a man and notice swelling or pain in the testicles.  You are a woman and notice swelling or pain in your vagina. Document Released: 08/08/2002 Document Revised: 03/08/2013 Document Reviewed: 12/06/2012 [facility] Patient Information 2014 [place], [place].  Vaginitis Vaginitis is an inflammation of the vagina. It is most often caused by a change in the normal balance of the bacteria and yeast that live in the vagina. This change in balance causes an overgrowth of certain bacteria or yeast, which causes the inflammation. There are different types of vaginitis, but the most common types are:  Bacterial vaginosis.  Yeast infection (candidiasis).  Trichomoniasis vaginitis. This is a sexually transmitted infection  (STI).  Viral vaginitis.  Atropic vaginitis.  Allergic vaginitis. CAUSES  The cause depends on the type of vaginitis. Vaginitis can be caused by:  Bacteria (bacterial vaginosis).  Yeast (yeast infection).  A parasite (trichomoniasis vaginitis)  A virus (viral vaginitis).  Low hormone levels (atrophic vaginitis). Low hormone levels can occur during pregnancy, breastfeeding, or after menopause.  Irritants, such as bubble baths, scented tampons, and feminine sprays (allergic vaginitis). Other factors can change the normal balance of the yeast and bacteria that live in the vagina. These include:  Antibiotic medicines.  Poor hygiene.  Diaphragms, vaginal sponges, spermicides, birth control pills, and intrauterine devices (IUD).  Sexual intercourse.  Infection.  Uncontrolled diabetes.  A weakened immune system. SYMPTOMS  Symptoms can vary depending on the  cause of the vaginitis. Common symptoms include:  Abnormal vaginal discharge.  The discharge is white, gray, or yellow with bacterial vaginosis.  The discharge is thick, white, and cheesy with a yeast infection.  The discharge is frothy and yellow or greenish with trichomoniasis.  A bad vaginal odor.  The odor is fishy with bacterial vaginosis.  Vaginal itching, pain, or swelling.  Painful intercourse.  Pain or burning when urinating. Sometimes, there are no symptoms. TREATMENT  Treatment will vary depending on the type of infection.   Bacterial vaginosis and trichomoniasis are often treated with antibiotic creams or pills.  Yeast infections are often treated with antifungal medicines, such as vaginal creams or suppositories.  Viral vaginitis has no cure, but symptoms can be treated with medicines that relieve discomfort. Your sexual partner should be treated as well.  Atrophic vaginitis may be treated with an estrogen cream, pill, suppository, or vaginal ring. If vaginal dryness occurs, lubricants and  moisturizing creams may help. You may be told to avoid scented soaps, sprays, or douches.  Allergic vaginitis treatment involves quitting the use of the product that is causing the problem. Vaginal creams can be used to treat the symptoms. HOME CARE INSTRUCTIONS   Take all medicines as directed by your caregiver.  Keep your genital area clean and dry. Avoid soap and only rinse the area with water.  Avoid douching. It can remove the healthy bacteria in the vagina.  Do not use tampons or have sexual intercourse until your vaginitis has been treated. Use sanitary pads while you have vaginitis.  Wipe from front to back. This avoids the spread of bacteria from the rectum to the vagina.  Let air reach your genital area.  Wear cotton underwear to decrease moisture buildup.  Avoid wearing underwear while you sleep until your vaginitis is gone.  Avoid tight pants and underwear or nylons without a cotton panel.  Take off wet clothing (especially bathing suits) as soon as possible.  Use mild, non-scented products. Avoid using irritants, such as:  Scented feminine sprays.  Fabric softeners.  Scented detergents.  Scented tampons.  Scented soaps or bubble baths.  Practice safe sex and use condoms. Condoms may prevent the spread of trichomoniasis and viral vaginitis. SEEK MEDICAL CARE IF:   You have abdominal pain.  You have a fever or persistent symptoms for more than 2 3 days.  You have a fever and your symptoms suddenly get worse. Document Released: 03/15/2007 Document Revised: 02/10/2012 Document Reviewed: 10/29/2011 [facility] Patient Information 2014 [place], [place].

## 2013-07-26 NOTE — ED Notes (Signed)
Pt c/o vaginal itching and bloating/abd pain x 2 days.  Denies NVD.

## 2013-07-26 NOTE — ED Provider Notes (Signed)
CSN: [id number]     Arrival date & time 07/26/13  1925 History   First MD Initiated Contact with Patient 07/26/13 2208     Chief Complaint  Patient presents with  . Abdominal Pain     (Consider location/radiation/quality/duration/timing/severity/associated sxs/prior Treatment) HPI Comments: Pt is a 23 y/o female with a PMHx of trichomoniasis and Chlamydia who presents to the emergency department complaining of lower abdominal pain and bloating x2 days. Describes the pain as cramping, intermittent, nothing in specific makes it come or go. States she was treated for urinary tract infection one month ago, however only took one pill of the antibiotic and then lost the rest. Admits to increased urinary frequency and urgency without dysuria. Denies nausea, vomiting or diarrhea. Also complaining of intermittent vaginal itching, denies discharge or bleeding. Last menstrual period was one week ago and normal. States she has not had sexual intercourse in over 1 month. Pt also reports she was told by her ob/gyn after giving birth she had some cells on her cervix that were concerning for HPV, she was supposed to f/u in 6 months which she did not.  Patient is a 23 y.o. female presenting with abdominal pain. The history is provided by the patient.  Abdominal Pain   Past Medical History  Diagnosis Date  . Headache(784.0)   . Heart murmur   . Trichimoniasis   . H/O chlamydia infection    Past Surgical History  Procedure Laterality Date  . Root canal    . Wisdom tooth extraction     Family History  Problem Relation Age of Onset  . Asthma Mother   . Hypertension Mother   . Asthma Father   . Heart disease Father   . Hypertension Father   . Hyperlipidemia Father   . Vision loss Father   . Anesthesia problems Neg Hx   . Hypotension Neg Hx   . Malignant hyperthermia Neg Hx   . Pseudochol deficiency Neg Hx   . Other Neg Hx    History  Substance Use Topics  . Smoking status: Current Every Day  Smoker -- 0.50 packs/day for 6 years    Types: Cigarettes  . Smokeless tobacco: Never Used  . Alcohol Use: No   OB History   Grav Para Term Preterm Abortions TAB SAB Ect Mult Living   2 2 2  0 0 0 0 0 0 2     Review of Systems  Gastrointestinal: Positive for abdominal pain.  Genitourinary: Positive for frequency.       Positive for vaginal itching.  All other systems reviewed and are negative.      Allergies  Review of patient's allergies indicates no known allergies.  Home Medications   Current Outpatient Rx  Name  Route  Sig  Dispense  Refill  . ibuprofen (ADVIL,MOTRIN) 200 MG tablet   Oral   Take 400 mg by mouth every 6 (six) hours as needed (pain).         . cephALEXin (KEFLEX) 500 MG capsule   Oral   Take 1 capsule (500 mg total) by mouth 4 (four) times daily.   20 capsule   0   . doxycycline (VIBRAMYCIN) 100 MG capsule   Oral   Take 1 capsule (100 mg total) by mouth 2 (two) times daily. One po bid x 7 days   14 capsule   0    BP 112/69  Pulse 64  Temp(Src) 98.7 F (37.1 C) (Oral)  Resp 16  SpO2 100%  Physical Exam  Nursing note and vitals reviewed. Constitutional: She is oriented to person, place, and time. She appears well-developed and well-nourished. No distress.  HENT:  Head: Normocephalic and atraumatic.  Mouth/Throat: Oropharynx is clear and moist.  Eyes: Conjunctivae are normal.  Neck: Normal range of motion. Neck supple.  Cardiovascular: Normal rate, regular rhythm and normal heart sounds.   Pulmonary/Chest: Effort normal and breath sounds normal.  Abdominal: Soft. Normal appearance and bowel sounds are normal. She exhibits no distension. There is tenderness (mild) in the suprapubic area. There is no rigidity, no rebound, no guarding and no CVA tenderness.  Genitourinary: Uterus normal. Cervix exhibits discharge. Cervix exhibits no motion tenderness and no friability. Right adnexum displays no mass, no tenderness and no fullness. Left adnexum  displays no mass, no tenderness and no fullness. No erythema, tenderness or bleeding around the vagina. No foreign body around the vagina. Vaginal discharge (clear/white) found.  Few small erythematous lesions on cervix.  Musculoskeletal: Normal range of motion. She exhibits no edema.  Neurological: She is alert and oriented to person, place, and time.  Skin: Skin is warm and dry. She is not diaphoretic.  Psychiatric: She has a normal mood and affect. Her behavior is normal.    ED Course  Procedures (including critical care time) Labs Review Labs Reviewed  WET PREP, GENITAL - Abnormal; Notable for the following:    Clue Cells Wet Prep HPF POC FEW (*)    WBC, Wet Prep HPF POC MANY (*)    All other components within normal limits  CBC WITH DIFFERENTIAL - Abnormal; Notable for the following:    RBC 3.76 (*)    Hemoglobin 11.7 (*)    HCT 34.8 (*)    All other components within normal limits  COMPREHENSIVE METABOLIC PANEL - Abnormal; Notable for the following:    Total Bilirubin 0.2 (*)    All other components within normal limits  URINALYSIS, ROUTINE W REFLEX MICROSCOPIC - Abnormal; Notable for the following:    APPearance CLOUDY (*)    Hgb urine dipstick TRACE (*)    Leukocytes, UA MODERATE (*)    All other components within normal limits  URINE MICROSCOPIC-ADD ON - Abnormal; Notable for the following:    Squamous Epithelial / LPF FEW (*)    All other components within normal limits  GC/CHLAMYDIA PROBE AMP  LIPASE, BLOOD  POC URINE PREG, ED   Imaging Review No results found.  EKG Interpretation   None       MDM   Final diagnoses:  Vaginitis  Cervicitis  UTI (lower urinary tract infection)    His presenting with abdominal pain, vaginal itching and increased urinary frequency. She is well appearing and in no apparent distress, afebrile with normal vital signs. Labs obtained in triage prior to patient being seen, and 77 white blood cells, moderate leukocytes with only  few squamous epithelial cells and urine. On pelvic exam, no cervical motion tenderness. Wet prep positive for many white blood cells. History of STDs. Patient agreeable to treatment with Rocephin and azithromycin. Stable for discharge home, will treat UTI with Keflex, cervicitis with doxycycline. Advised her to followup with her OB/GYN regarding her prior abnormal Pap smear, especially with the erythema noted on cervix. Return precautions given. Patient states understanding of treatment care plan and is agreeable.     Trevor MaceRobyn M Albert, PA-C 07/26/13 2358

## 2013-07-27 LAB — GC/CHLAMYDIA PROBE AMP
CT PROBE, AMP APTIMA: NEGATIVE
GC Probe RNA: NEGATIVE

## 2013-07-27 MED ORDER — ONDANSETRON 4 MG PO TBDP
4.0000 mg | ORAL_TABLET | Freq: Once | ORAL | Status: AC
  Administered 2013-07-27: 4 mg via ORAL
  Filled 2013-07-27: qty 1

## 2013-07-27 NOTE — ED Provider Notes (Signed)
Medical screening examination/treatment/procedure(s) were performed by non-physician practitioner and as supervising physician I was immediately available for consultation/collaboration.  EKG Interpretation   None         Latash Nouri S Alianis Trimmer, MD 07/27/13 0048 

## 2014-01-05 ENCOUNTER — Inpatient Hospital Stay (HOSPITAL_COMMUNITY)
Admission: AD | Admit: 2014-01-05 | Discharge: 2014-01-05 | Disposition: A | Discharge status: Home / Self Care | Payer: Medicaid Other | Source: Ambulatory Visit | Attending: Obstetrics | Admitting: Obstetrics

## 2014-01-05 ENCOUNTER — Encounter (HOSPITAL_COMMUNITY): Payer: Self-pay | Admitting: *Deleted

## 2014-01-05 DIAGNOSIS — F17200 Nicotine dependence, unspecified, uncomplicated: Secondary | ICD-10-CM | POA: Insufficient documentation

## 2014-01-05 DIAGNOSIS — Z3202 Encounter for pregnancy test, result negative: Secondary | ICD-10-CM

## 2014-01-05 DIAGNOSIS — A599 Trichomoniasis, unspecified: Secondary | ICD-10-CM

## 2014-01-05 DIAGNOSIS — N898 Other specified noninflammatory disorders of vagina: Secondary | ICD-10-CM | POA: Diagnosis present

## 2014-01-05 DIAGNOSIS — A5901 Trichomonal vulvovaginitis: Secondary | ICD-10-CM | POA: Insufficient documentation

## 2014-01-05 DIAGNOSIS — R300 Dysuria: Secondary | ICD-10-CM

## 2014-01-05 LAB — URINALYSIS, ROUTINE W REFLEX MICROSCOPIC
Bilirubin Urine: NEGATIVE
GLUCOSE, UA: NEGATIVE mg/dL
KETONES UR: NEGATIVE mg/dL
Nitrite: NEGATIVE
PROTEIN: NEGATIVE mg/dL
Specific Gravity, Urine: <1.005 — ABNORMAL LOW (ref 1.005–1.030)
Urobilinogen, UA: 0.2 mg/dL (ref 0.0–1.0)
pH: 7 (ref 5.0–8.0)

## 2014-01-05 LAB — WET PREP, GENITAL
Clue Cells Wet Prep HPF POC: NONE SEEN
YEAST WET PREP: NONE SEEN

## 2014-01-05 LAB — URINE MICROSCOPIC-ADD ON

## 2014-01-05 LAB — POCT PREGNANCY, URINE: Preg Test, Ur: NEGATIVE

## 2014-01-05 LAB — GLUCOSE, CAPILLARY: GLUCOSE-CAPILLARY: 82 mg/dL (ref 70–99)

## 2014-01-05 MED ORDER — METRONIDAZOLE 500 MG PO TABS
2000.0000 mg | ORAL_TABLET | Freq: Once | ORAL | Status: AC
  Administered 2014-01-05: 2000 mg via ORAL
  Filled 2014-01-05: qty 4

## 2014-01-05 MED ORDER — METRONIDAZOLE 500 MG PO TABS: 500.0000 mg | ORAL_TABLET | Freq: Once | ORAL | Status: DC

## 2014-01-05 NOTE — Discharge Instructions (Signed)

## 2014-01-05 NOTE — MAU Note (Signed)
Patient states she had a period sometime in July but not sure of the date, states she took Plan B 3 separate times a few days apart but has not had a period. States she has been having vaginal discharge with odor and itching and used OTC medication but has not worked. Has had urinary frequency with burning for about 2 weeks.

## 2014-01-05 NOTE — MAU Provider Note (Signed)
History     CSN: [id number]  Arrival date and time: 01/05/14 1801   None     Chief Complaint  Patient presents with  . Possible Pregnancy  . Vaginal Discharge  . Urinary Urgency   HPI  Kim Harvey is a 23 y.o. female [phone]; non-pregnant who presents with complaints of urinary urgency and vaginal discharge. She thought she had a yeast infection and tried medication over the counter that she took inconsistently which did not resolve the symptoms. The discharge is described as creamy, yellow, thick with an odor. No new sexual partners at this time; patient has one partner. The patient has had urinary tract infections in the past and she feels her symptoms are similar to that. She feels she may have a urinary tract infection and a yeast infection. She is concerned because she has not had a period in 2 months. She recently took the plan B medication thinking she was pregnant and since then her period has been irregular. She has taken pregnancy tests and they have all been negative. At times she feels pregnancy and endures symptoms such as dizziness. Eats a normal diet, however drinks limited water throughout the day.   OB History   Grav Para Term Preterm Abortions TAB SAB Ect Mult Living   2 2 2  0 0 0 0 0 0 2      Past Medical History  Diagnosis Date  . Headache(784.0)   . Heart murmur   . Trichimoniasis   . H/O chlamydia infection     Past Surgical History  Procedure Laterality Date  . Root canal    . Wisdom tooth extraction      Family History  Problem Relation Age of Onset  . Asthma Mother   . Hypertension Mother   . Asthma Father   . Heart disease Father   . Hypertension Father   . Hyperlipidemia Father   . Vision loss Father   . Anesthesia problems Neg Hx   . Hypotension Neg Hx   . Malignant hyperthermia Neg Hx   . Pseudochol deficiency Neg Hx   . Other Neg Hx     History  Substance Use Topics  . Smoking status: Current Every Day Smoker -- 0.50  packs/day for 6 years    Types: Cigarettes  . Smokeless tobacco: Never Used  . Alcohol Use: No    Allergies: No Known Allergies  Prescriptions prior to admission  Medication Sig Dispense Refill  . ibuprofen (ADVIL,MOTRIN) 200 MG tablet Take 400-800 mg by mouth daily as needed for headache.        Results for orders placed during the hospital encounter of 01/05/14 (from the past 48 hour(s))  URINALYSIS, ROUTINE W REFLEX MICROSCOPIC     Status: Abnormal   Collection Time    01/05/14  6:30 PM      Result Value Ref Range   Color, Urine STRAW (*) YELLOW   APPearance CLEAR  CLEAR   Specific Gravity, Urine <1.005 (*) 1.005 - 1.030   pH 7.0  5.0 - 8.0   Glucose, UA NEGATIVE  NEGATIVE mg/dL   Hgb urine dipstick SMALL (*) NEGATIVE   Bilirubin Urine NEGATIVE  NEGATIVE   Ketones, ur NEGATIVE  NEGATIVE mg/dL   Protein, ur NEGATIVE  NEGATIVE mg/dL   Urobilinogen, UA 0.2  0.0 - 1.0 mg/dL   Nitrite NEGATIVE  NEGATIVE   Leukocytes, UA MODERATE (*) NEGATIVE  URINE MICROSCOPIC-ADD ON     Status: Abnormal  Collection Time    01/05/14  6:30 PM      Result Value Ref Range   Squamous Epithelial / LPF MANY (*) RARE   WBC, UA 3-6  <3 WBC/hpf   RBC / HPF 3-6  <3 RBC/hpf   Bacteria, UA FEW (*) RARE   Urine-Other TRICHOMONAS PRESENT    POCT PREGNANCY, URINE     Status: None   Collection Time    01/05/14  6:39 PM      Result Value Ref Range   Preg Test, Ur NEGATIVE  NEGATIVE   Comment:            THE SENSITIVITY OF THIS     METHODOLOGY IS >24 mIU/mL  GLUCOSE, CAPILLARY     Status: None   Collection Time    01/05/14  7:36 PM      Result Value Ref Range   Glucose-Capillary 82  70 - 99 mg/dL  WET PREP, GENITAL     Status: Abnormal   Collection Time    01/05/14  7:55 PM      Result Value Ref Range   Yeast Wet Prep HPF POC NONE SEEN  NONE SEEN   Trich, Wet Prep FEW (*) NONE SEEN   Clue Cells Wet Prep HPF POC NONE SEEN  NONE SEEN   WBC, Wet Prep HPF POC MODERATE (*) NONE SEEN   Comment: MANY  BACTERIA SEEN    Review of Systems  Constitutional: Positive for chills. Negative for weight loss and malaise/fatigue.  Gastrointestinal: Positive for constipation. Negative for nausea, vomiting, abdominal pain and diarrhea.  Genitourinary: Positive for dysuria, urgency, frequency and flank pain. Negative for hematuria.  Musculoskeletal: Positive for back pain.  Neurological: Positive for dizziness.   Physical Exam   Blood pressure 120/65, pulse 91, temperature 98.4 F (36.9 C), temperature source Oral, resp. rate 16, height 5' 5.5" (1.664 m), weight 56.972 kg (125 lb 9.6 oz), SpO2 99.00%, not currently breastfeeding.  Physical Exam  Constitutional: She is oriented to person, place, and time. She appears well-developed and well-nourished. No distress.  HENT:  Head: Normocephalic.  Eyes: Pupils are equal, round, and reactive to light.  Neck: Neck supple.  Respiratory: Effort normal.  GI: Soft. There is tenderness (+suprapubic tenderness ).  Genitourinary: Vaginal discharge found.  Speculum exam: Vagina - Large amount of creamy, pale yellow discharge, no odor Cervix - No contact bleeding Bimanual exam: Cervix closed, no CMT  Uterus non tender, normal size Adnexa non tender, no masses bilaterally GC/Chlam, wet prep done Chaperone present for exam.   Neurological: She is alert and oriented to person, place, and time.  Skin: Skin is warm. She is not diaphoretic.    MAU Course  Procedures None  MDM Flagyl 2 grams PO in MAU for the treatment of Trichomonas.  BS done and within normal range.  Urine culture pending.  Wet prep GC   Assessment and Plan   Assessment:  1. Trichomonas infection   2. Encounter for pregnancy test with result negative   3. Dysuria    Plan:  Discharge home in stable condition Follow up with the HD for birth control planning Condoms always Your partner needs to be informed of +STI. He will need treatment.  Increase daily water intake.    Iona Hansen Joanmarie Tsang, NP  01/05/2014, 8:23 PM

## 2014-01-06 LAB — URINE CULTURE
COLONY COUNT: NO GROWTH
Culture: NO GROWTH

## 2014-01-06 LAB — HIV ANTIBODY (ROUTINE TESTING W REFLEX): HIV: NONREACTIVE

## 2014-01-06 LAB — GC/CHLAMYDIA PROBE AMP
CT PROBE, AMP APTIMA: NEGATIVE
GC Probe RNA: POSITIVE — AB

## 2014-01-07 NOTE — MAU Provider Note (Signed)
Attestation of Attending Supervision of Advanced Practitioner (CNM/NP): Evaluation and management procedures were performed by the Advanced Practitioner under my supervision and collaboration. I have reviewed the Advanced Practitioner's note and chart, and I agree with the management and plan.  Lillionna Nabi H. 11:09 AM

## 2014-01-10 ENCOUNTER — Telehealth: Payer: Self-pay

## 2014-01-10 NOTE — Telephone Encounter (Addendum)
Attempted to call patient regarding results. No answer. Left message stating we are calling with results, please call clinic. Patient needs to set up nurse visit appointment to come [facility]ed to [facility].   **Per protocol - pt also requires treatment for Chlamydia.  Diane Day RNC

## 2014-01-10 NOTE — Telephone Encounter (Signed)
Message copied by Louanna RawAMPBELL, Braelin Costlow M on Wed Jan 10, 2014 11:53 AM ------      Message from: Odelia GageLINTON, CHERYL A      Created: Wed Jan 10, 2014 11:20 AM                   ----- Message -----         From: [place] Presnell         Sent: 01/08/2014   1:42 PM           To: [facility] Admin Pool                        ----- Message -----         From: [facility]         Sent: 01/06/2014   8:05 AM           To: Stoney BangMau Results             ------

## 2014-01-11 ENCOUNTER — Ambulatory Visit (INDEPENDENT_AMBULATORY_CARE_PROVIDER_SITE_OTHER): Payer: Medicaid Other | Admitting: General Practice

## 2014-01-11 VITALS — BP 118/76 | HR 71 | Temp 98.3°F | Ht 65.0 in | Wt 123.4 lb

## 2014-01-11 DIAGNOSIS — A549 Gonococcal infection, unspecified: Secondary | ICD-10-CM

## 2014-01-11 DIAGNOSIS — A599 Trichomoniasis, unspecified: Secondary | ICD-10-CM

## 2014-01-11 DIAGNOSIS — A5400 Gonococcal infection of lower genitourinary tract, unspecified: Secondary | ICD-10-CM

## 2014-01-11 MED ORDER — METRONIDAZOLE 500 MG PO TABS: 2000.0000 mg | ORAL_TABLET | Freq: Once | ORAL | Status: DC

## 2014-01-11 MED ORDER — CEFTRIAXONE SODIUM 250 MG IJ SOLR
250.0000 mg | Freq: Once | INTRAMUSCULAR | Status: AC
  Administered 2014-01-11: 250 mg via INTRAMUSCULAR

## 2014-01-11 MED ORDER — AZITHROMYCIN 250 MG PO TABS
1000.0000 mg | ORAL_TABLET | Freq: Once | ORAL | Status: AC
  Administered 2014-01-11: 1000 mg via ORAL

## 2014-01-11 NOTE — Progress Notes (Signed)
Patient here today for gonorrhea treatment. States when she was here the other day she got sick and threw up the medication for trichomonas right after and they told her to let us know and we can give her medication. Sent Rx to pharmacy per protocol.

## 2014-01-12 NOTE — Telephone Encounter (Signed)
Patient came in yesterday for STD treatment.

## 2014-04-02 ENCOUNTER — Encounter (HOSPITAL_COMMUNITY): Payer: Self-pay | Admitting: *Deleted

## 2014-05-10 ENCOUNTER — Other Ambulatory Visit: Payer: Self-pay | Admitting: Medical

## 2014-05-10 ENCOUNTER — Inpatient Hospital Stay (EMERGENCY_DEPARTMENT_HOSPITAL)
Admission: AD | Admit: 2014-05-10 | Discharge: 2014-05-10 | Disposition: A | Discharge status: Home / Self Care | Payer: Medicaid Other | Source: Ambulatory Visit | Attending: Obstetrics & Gynecology | Admitting: Obstetrics & Gynecology

## 2014-05-10 ENCOUNTER — Emergency Department (HOSPITAL_COMMUNITY)
Admission: EM | Admit: 2014-05-10 | Discharge: 2014-05-10 | Disposition: A | Discharge status: Home / Self Care | Payer: Medicaid Other | Attending: Emergency Medicine | Admitting: Emergency Medicine

## 2014-05-10 ENCOUNTER — Encounter (HOSPITAL_COMMUNITY): Payer: Self-pay

## 2014-05-10 ENCOUNTER — Emergency Department (HOSPITAL_COMMUNITY): Payer: Medicaid Other

## 2014-05-10 DIAGNOSIS — N61 Inflammatory disorders of breast: Secondary | ICD-10-CM | POA: Diagnosis not present

## 2014-05-10 DIAGNOSIS — R011 Cardiac murmur, unspecified: Secondary | ICD-10-CM | POA: Insufficient documentation

## 2014-05-10 DIAGNOSIS — M545 Low back pain: Secondary | ICD-10-CM | POA: Insufficient documentation

## 2014-05-10 DIAGNOSIS — R064 Hyperventilation: Secondary | ICD-10-CM | POA: Insufficient documentation

## 2014-05-10 DIAGNOSIS — R61 Generalized hyperhidrosis: Secondary | ICD-10-CM | POA: Diagnosis not present

## 2014-05-10 DIAGNOSIS — R079 Chest pain, unspecified: Secondary | ICD-10-CM

## 2014-05-10 DIAGNOSIS — N644 Mastodynia: Secondary | ICD-10-CM

## 2014-05-10 DIAGNOSIS — Z79899 Other long term (current) drug therapy: Secondary | ICD-10-CM | POA: Diagnosis not present

## 2014-05-10 DIAGNOSIS — R6883 Chills (without fever): Secondary | ICD-10-CM | POA: Insufficient documentation

## 2014-05-10 DIAGNOSIS — R5383 Other fatigue: Secondary | ICD-10-CM | POA: Diagnosis not present

## 2014-05-10 DIAGNOSIS — Z3202 Encounter for pregnancy test, result negative: Secondary | ICD-10-CM | POA: Diagnosis not present

## 2014-05-10 DIAGNOSIS — F17210 Nicotine dependence, cigarettes, uncomplicated: Secondary | ICD-10-CM

## 2014-05-10 DIAGNOSIS — Z8619 Personal history of other infectious and parasitic diseases: Secondary | ICD-10-CM | POA: Insufficient documentation

## 2014-05-10 DIAGNOSIS — R112 Nausea with vomiting, unspecified: Secondary | ICD-10-CM | POA: Diagnosis not present

## 2014-05-10 DIAGNOSIS — N898 Other specified noninflammatory disorders of vagina: Secondary | ICD-10-CM | POA: Insufficient documentation

## 2014-05-10 DIAGNOSIS — Z202 Contact with and (suspected) exposure to infections with a predominantly sexual mode of transmission: Secondary | ICD-10-CM | POA: Insufficient documentation

## 2014-05-10 DIAGNOSIS — R531 Weakness: Secondary | ICD-10-CM | POA: Insufficient documentation

## 2014-05-10 DIAGNOSIS — Z720 Tobacco use: Secondary | ICD-10-CM | POA: Insufficient documentation

## 2014-05-10 DIAGNOSIS — Z793 Long term (current) use of hormonal contraceptives: Secondary | ICD-10-CM | POA: Diagnosis not present

## 2014-05-10 DIAGNOSIS — R0602 Shortness of breath: Secondary | ICD-10-CM | POA: Insufficient documentation

## 2014-05-10 DIAGNOSIS — R42 Dizziness and giddiness: Secondary | ICD-10-CM | POA: Insufficient documentation

## 2014-05-10 LAB — PRO B NATRIURETIC PEPTIDE: Pro B Natriuretic peptide (BNP): 29.1 pg/mL (ref 0–125)

## 2014-05-10 LAB — BASIC METABOLIC PANEL
ANION GAP: 21 — AB (ref 5–15)
Anion gap: 15 (ref 5–15)
BUN: 7 mg/dL (ref 6–23)
BUN: 5 mg/dL — ABNORMAL LOW (ref 6–23)
CHLORIDE: 101 meq/L (ref 96–112)
CO2: 13 mEq/L — ABNORMAL LOW (ref 19–32)
CO2: 18 mEq/L — ABNORMAL LOW (ref 19–32)
Calcium: 9.6 mg/dL (ref 8.4–10.5)
Calcium: 8.1 mg/dL — ABNORMAL LOW (ref 8.4–10.5)
Chloride: 105 mEq/L (ref 96–112)
Creatinine, Ser: 0.61 mg/dL (ref 0.50–1.10)
Creatinine, Ser: 0.57 mg/dL (ref 0.50–1.10)
GFR calc Af Amer: >90 mL/min (ref >90)
GFR calc non Af Amer: >90 mL/min (ref >90)
Glucose, Bld: 101 mg/dL — ABNORMAL HIGH (ref 70–99)
Glucose, Bld: 96 mg/dL (ref 70–99)
POTASSIUM: 3.8 meq/L (ref 3.7–5.3)
POTASSIUM: 3.9 meq/L (ref 3.7–5.3)
Sodium: 135 mEq/L — ABNORMAL LOW (ref 137–147)
Sodium: 138 mEq/L (ref 137–147)

## 2014-05-10 LAB — WET PREP, GENITAL
Trich, Wet Prep: NONE SEEN
Yeast Wet Prep HPF POC: NONE SEEN

## 2014-05-10 LAB — URINE MICROSCOPIC-ADD ON

## 2014-05-10 LAB — POC URINE PREG, ED: Preg Test, Ur: NEGATIVE

## 2014-05-10 LAB — CBC
HEMATOCRIT: 38.2 % (ref 36.0–46.0)
Hemoglobin: 12.6 g/dL (ref 12.0–15.0)
MCH: 30.7 pg (ref 26.0–34.0)
MCHC: 33 g/dL (ref 30.0–36.0)
MCV: 93.2 fL (ref 78.0–100.0)
PLATELETS: 177 10*3/uL (ref 150–400)
RBC: 4.1 MIL/uL (ref 3.87–5.11)
RDW: 12.9 % (ref 11.5–15.5)
WBC: 10.5 10*3/uL (ref 4.0–10.5)

## 2014-05-10 LAB — URINALYSIS, ROUTINE W REFLEX MICROSCOPIC
Bilirubin Urine: NEGATIVE
Glucose, UA: NEGATIVE mg/dL
Ketones, ur: 15 mg/dL — AB
NITRITE: NEGATIVE
Protein, ur: NEGATIVE mg/dL
SPECIFIC GRAVITY, URINE: 1.008 (ref 1.005–1.030)
UROBILINOGEN UA: 1 mg/dL (ref 0.0–1.0)
pH: 7 (ref 5.0–8.0)

## 2014-05-10 LAB — I-STAT TROPONIN, ED: TROPONIN I, POC: 0 ng/mL (ref 0.00–0.08)

## 2014-05-10 MED ORDER — MORPHINE SULFATE 4 MG/ML IJ SOLN
4.0000 mg | Freq: Once | INTRAMUSCULAR | Status: AC
  Administered 2014-05-10: 4 mg via INTRAVENOUS
  Filled 2014-05-10: qty 1

## 2014-05-10 MED ORDER — CEPHALEXIN 500 MG PO CAPS: 500.0000 mg | ORAL_CAPSULE | Freq: Four times a day (QID) | ORAL | Status: DC

## 2014-05-10 MED ORDER — TRAMADOL HCL 50 MG PO TABS: 50.0000 mg | ORAL_TABLET | Freq: Four times a day (QID) | ORAL | Status: DC | PRN

## 2014-05-10 MED ORDER — IBUPROFEN 800 MG PO TABS: 800.0000 mg | ORAL_TABLET | Freq: Three times a day (TID) | ORAL | Status: DC

## 2014-05-10 MED ORDER — SODIUM CHLORIDE 0.9 % IV BOLUS (SEPSIS)
1000.0000 mL | Freq: Once | INTRAVENOUS | Status: AC
  Administered 2014-05-10: 1000 mL via INTRAVENOUS

## 2014-05-10 MED ORDER — METRONIDAZOLE 500 MG PO TABS: 500.0000 mg | ORAL_TABLET | Freq: Two times a day (BID) | ORAL | Status: DC

## 2014-05-10 MED ORDER — ONDANSETRON 4 MG PO TBDP: 4.0000 mg | ORAL_TABLET | Freq: Three times a day (TID) | ORAL | Status: DC | PRN

## 2014-05-10 MED ORDER — ONDANSETRON HCL 4 MG/2ML IJ SOLN
4.0000 mg | Freq: Once | INTRAMUSCULAR | Status: AC
  Administered 2014-05-10: 4 mg via INTRAVENOUS
  Filled 2014-05-10: qty 2

## 2014-05-10 MED ORDER — AZITHROMYCIN 250 MG PO TABS
1000.0000 mg | ORAL_TABLET | Freq: Once | ORAL | Status: AC
  Administered 2014-05-10: 1000 mg via ORAL
  Filled 2014-05-10: qty 4

## 2014-05-10 MED ORDER — ACETAMINOPHEN 325 MG PO TABS
650.0000 mg | ORAL_TABLET | Freq: Once | ORAL | Status: AC
  Administered 2014-05-10: 650 mg via ORAL
  Filled 2014-05-10: qty 2

## 2014-05-10 MED ORDER — STERILE WATER FOR INJECTION IJ SOLN
INTRAMUSCULAR | Status: AC
  Administered 2014-05-10: 10 mL
  Filled 2014-05-10: qty 10

## 2014-05-10 MED ORDER — CEFTRIAXONE SODIUM 250 MG IJ SOLR
250.0000 mg | Freq: Once | INTRAMUSCULAR | Status: AC
  Administered 2014-05-10: 250 mg via INTRAMUSCULAR
  Filled 2014-05-10: qty 250

## 2014-05-10 MED ORDER — LORAZEPAM 2 MG/ML IJ SOLN
2.0000 mg | Freq: Once | INTRAMUSCULAR | Status: AC
  Administered 2014-05-10: 2 mg via INTRAVENOUS
  Filled 2014-05-10: qty 1

## 2014-05-10 NOTE — ED Provider Notes (Signed)
CSN: [id number]     Arrival date & time 05/10/14  1635 History   First MD Initiated Contact with Patient 05/10/14 1806     Chief Complaint  Patient presents with  . Chest Pain  . Shortness of Breath  . Dizziness     (Consider location/radiation/quality/duration/timing/severity/associated sxs/prior Treatment) HPI Comments: Patient is a 23 yo F G2P2002 presenting to the ED with complaint of continued left breast pain since yesterday. Patient states some redness has been noted as well as firmness and tenderness to touch. She denies any drainage, nipple discharge or bleeding. She was seen at the [facility] and diagnosed with cellulitis and sent home with antibiotics. She has been unable to tolerate the first dose of antibiotics due to pain, nausea and vomiting (two episodes non-bloody non-bilious). Patient states she has had generalized weakness, fatigue, SOB, chills, central chest pain, and low back pain. Patient is also complaining of an unknown amount at time of brown vaginal discharge. She was recently diagnosed with chlamydia, her boyfriend did not finish his course of antibiotics. No IVDA.   Patient is a 23 y.o. female presenting with chest pain, shortness of breath, and dizziness.  Chest Pain Associated symptoms: diaphoresis, dizziness, fever, nausea, shortness of breath and vomiting   Shortness of Breath Associated symptoms: chest pain, diaphoresis, fever and vomiting   Dizziness Associated symptoms: chest pain, nausea, shortness of breath and vomiting     Past Medical History  Diagnosis Date  . Headache(784.0)   . Heart murmur   . Trichimoniasis   . H/O chlamydia infection    Past Surgical History  Procedure Laterality Date  . Root canal    . Wisdom tooth extraction     Family History  Problem Relation Age of Onset  . Asthma Mother   . Hypertension Mother   . Asthma Father   . Heart disease Father   . Hypertension Father   . Hyperlipidemia Father   . Vision loss Father   .  Anesthesia problems Neg Hx   . Hypotension Neg Hx   . Malignant hyperthermia Neg Hx   . Pseudochol deficiency Neg Hx   . Other Neg Hx    History  Substance Use Topics  . Smoking status: Current Every Day Smoker -- 0.50 packs/day for 6 years    Types: Cigarettes  . Smokeless tobacco: Never Used  . Alcohol Use: No   OB History    Gravida Para Term Preterm AB TAB SAB Ectopic Multiple Living   2 2 2  0 0 0 0 0 0 2     Review of Systems  Constitutional: Positive for fever and diaphoresis.  Respiratory: Positive for shortness of breath.   Cardiovascular: Positive for chest pain.  Gastrointestinal: Positive for nausea and vomiting.  Genitourinary: Positive for vaginal discharge.  Skin: Positive for color change.  Neurological: Positive for dizziness.  All other systems reviewed and are negative.     Allergies  Review of patient's allergies indicates no known allergies.  Home Medications   Prior to Admission medications   Medication Sig Start Date End Date Taking? Authorizing Provider  cephALEXin (KEFLEX) 500 MG capsule Take 1 capsule (500 mg total) by mouth 4 (four) times daily. 05/10/14  Yes Marny LowensteinJulie N Wenzel, PA-C  ibuprofen (ADVIL,MOTRIN) 800 MG tablet Take 1 tablet (800 mg total) by mouth 3 (three) times daily. 05/10/14  Yes Marny LowensteinJulie N Wenzel, PA-C  Olopatadine HCl (PATADAY) 0.2 % SOLN Place 1 drop into both eyes daily.   Yes Historical  Provider, MD  medroxyPROGESTERone (DEPO-PROVERA) 150 MG/ML injection Inject 150 mg into the muscle every 3 (three) months.    Historical Provider, MD  metroNIDAZOLE (FLAGYL) 500 MG tablet Take 1 tablet (500 mg total) by mouth 2 (two) times daily. 05/10/14   Square Jowett L Geralyn Figiel, PA-C  ondansetron (ZOFRAN ODT) 4 MG disintegrating tablet Take 1 tablet (4 mg total) by mouth every 8 (eight) hours as needed for nausea. 05/10/14   Dalon Reichart L Ferrell Flam, PA-C  traMADol (ULTRAM) 50 MG tablet Take 1 tablet (50 mg total) by mouth every 6 (six) hours as  needed. 05/10/14   Marlina Cataldi L Milford Cilento, PA-C   BP 93/50 mmHg  Pulse 93  Resp 14  SpO2 100%  LMP 03/26/2014 Physical Exam  Constitutional: She is oriented to person, place, and time. She appears well-developed and well-nourished. No distress.  HENT:  Head: Normocephalic and atraumatic.  Right Ear: External ear normal.  Left Ear: External ear normal.  Nose: Nose normal.  Mouth/Throat: Oropharynx is clear and moist. No oropharyngeal exudate.  Eyes: Conjunctivae are normal.  Neck: Normal range of motion. Neck supple.  Cardiovascular: Normal rate, regular rhythm, normal heart sounds and intact distal pulses.   Pulmonary/Chest: Breath sounds normal. No accessory muscle usage. No respiratory distress. She exhibits no tenderness (at left breast infection, otherwise none). Left breast exhibits skin change (lightly erythematous streaking noted from 10:00 to 2:00. No palpable masses) and tenderness (moderate tenderness to palpation). .  Patient hyperventilating.     Abdominal: Soft. Bowel sounds are normal. There is no tenderness.  Musculoskeletal: Normal range of motion. She exhibits no edema or tenderness.       Cervical back: She exhibits no tenderness and no bony tenderness.       Thoracic back: She exhibits no tenderness and no bony tenderness.       Lumbar back: She exhibits no tenderness and no bony tenderness.  Lymphadenopathy:    She has no cervical adenopathy.  Neurological: She is alert and oriented to person, place, and time.  Skin: Skin is warm and dry. She is not diaphoretic.  Psychiatric: She has a normal mood and affect.  Nursing note and vitals reviewed.  Exam performed by Francee PiccoloPIEPENBRINK, Ariah Mower L,  exam chaperoned Date: 05/10/2014 Pelvic exam: normal external genitalia without evidence of trauma. VULVA: normal appearing vulva with no masses, tenderness or lesion. VAGINA: normal appearing vagina with normal color and discharge, no lesions. CERVIX: normal appearing  cervix without lesions, cervical motion tenderness absent, cervical os closed with out purulent discharge; vaginal discharge - bloody, Wet prep and DNA probe for chlamydia and GC obtained.   ADNEXA: normal adnexa in size, nontender and no masses UTERUS: uterus is normal size, shape, consistency and nontender.   ED Course  Procedures (including critical care time) Medications  sodium chloride 0.9 % bolus 1,000 mL (0 mLs Intravenous Stopped 05/10/14 2002)  LORazepam (ATIVAN) injection 2 mg (2 mg Intravenous Given 05/10/14 1909)  morphine 4 MG/ML injection 4 mg (4 mg Intravenous Given 05/10/14 1909)  ondansetron (ZOFRAN) injection 4 mg (4 mg Intravenous Given 05/10/14 1920)  sodium chloride 0.9 % bolus 1,000 mL (0 mLs Intravenous Stopped 05/10/14 2123)  acetaminophen (TYLENOL) tablet 650 mg (650 mg Oral Given 05/10/14 2033)  cefTRIAXone (ROCEPHIN) injection 250 mg (250 mg Intramuscular Given 05/10/14 2036)  azithromycin (ZITHROMAX) tablet 1,000 mg (1,000 mg Oral Given 05/10/14 2106)  sterile water (preservative free) injection (10 mLs  Given 05/10/14 2036)    Labs Review Labs Reviewed  WET PREP, GENITAL - Abnormal; Notable for the following:    Clue Cells Wet Prep HPF POC RARE (*)    WBC, Wet Prep HPF POC TOO NUMEROUS TO COUNT (*)    All other components within normal limits  BASIC METABOLIC PANEL - Abnormal; Notable for the following:    Sodium 135 (*)    CO2 13 (*)    Glucose, Bld 101 (*)    Anion gap 21 (*)    All other components within normal limits  URINALYSIS, ROUTINE W REFLEX MICROSCOPIC - Abnormal; Notable for the following:    APPearance HAZY (*)    Hgb urine dipstick LARGE (*)    Ketones, ur 15 (*)    Leukocytes, UA LARGE (*)    All other components within normal limits  BASIC METABOLIC PANEL - Abnormal; Notable for the following:    CO2 18 (*)    BUN 5 (*)    Calcium 8.1 (*)    All other components within normal limits  GC/CHLAMYDIA PROBE AMP  CBC  PRO B  NATRIURETIC PEPTIDE  HIV ANTIBODY (ROUTINE TESTING)  URINE MICROSCOPIC-ADD ON  Rosezena Sensor, ED  POC URINE PREG, ED    Imaging Review Dg Chest 2 View  05/10/2014   CLINICAL DATA:  Mid chest pain and pain in the left axilla. Shortness of breath and fever since yesterday.  EXAM: CHEST  2 VIEW  COMPARISON:  08/20/2009  FINDINGS: Heart size is normal. Lungs are clear. No edema. Visualized osseous structures have a normal appearance.  IMPRESSION: No active cardiopulmonary disease.   Electronically Signed   By: Rosalie Gums M.D.   On: 05/10/2014 17:33     EKG Interpretation None      MDM   Final diagnoses:  Mastitis, left, acute  STD exposure  Hyperventilating    Filed Vitals:   05/10/14 2243  BP: 93/50  Pulse: 93  Resp: 14   Afebrile, NAD, non-toxic appearing, AAOx4.   I have reviewed nursing notes, vital signs, and all appropriate lab and imaging results for this patient.  Acidosis likely secondary to hyperventilation, as only CO2 is decreased, improved with patient's improved respiratory rate with gentle rehydration.   1) Mastitis: Patient with non-lactational mastitis. No history of IVDA, likely secondary to sports bra wearing. Patient just started on Abx today for mastitis from WOC. Advised to continue the antibiotics. Will provide nausea and pain control.   2) STD Exposure: Patient to be discharged with instructions to follow up with OBGYN. Pt understands GC/Chlamydia cultures pending and that they will need to inform all sexual partners within the last 6 months if results return positive. Pt has been treated prophylacticly with azithromycin and rocephin due to pts history, pelvic exam, and wet prep with increased WBCs. Pt advised that she will receive a call in 48 hours if the test is positive and to refrain from sexual activity for 48 hours. If the test is positive, pt is advised to refrain from sexual activity for 10 days for the medicine to take effect.  Pt not  concerning for PID because hemodynamically stable and no cervical motion tenderness on pelvic exam. Pt has also been treated with flagyl for Bacterial Vaginosis. Pt has been advised to not drink alcohol while on this medication.   Discussed that because pt has had recent unprotected sex, might want to consider getting tested for HIV as well. Counseled pt that latex condoms are the only way to prevent against STDs or HIV.  Return precautions discussed. Patient is agreeable to plan.  Patient is stable at time of discharge Patient d/w with Dr. Littie Deeds , agrees with plan.      Jeannetta Ellis, PA-C 05/11/14 0011  Mirian Mo, MD 05/14/14 [phone]

## 2014-05-10 NOTE — Discharge Instructions (Signed)
Please follow up with your primary care physician in 1-2 days. If you do not have one please [facility] the [facility] number listed above. Please follow up with your Ob/Gyn to schedule a follow up appointment.  Please finish all course of antibiotics and wait to have sexual intercourse for 10 days to allow antibiotics to work. Please take pain medication and/or muscle relaxants as prescribed and as needed for pain. Please do not drive on narcotic pain medication or on muscle relaxants. Please read all discharge instructions and return precautions.   Mastitis Mastitis is inflammation of the breast tissue. It occurs most often in women who are breastfeeding, but it can also affect other women, and even sometimes men. CAUSES  Mastitis is usually caused by a bacterial infection. Bacteria enter the breast tissue through cuts or openings in the skin. Typically, this occurs with breastfeeding because of cracked or irritated skin. Sometimes, it can occur even when there is no opening in the skin. It can be associated with plugged milk (lactiferous) ducts. Nipple piercing can also lead to mastitis. Also, some forms of breast cancer can cause mastitis. SIGNS AND SYMPTOMS   Swelling, redness, tenderness, and pain in an area of the breast.  Swelling of the glands under the arm on the same side.  Fever. If an infection is allowed to progress, a collection of pus (abscess) may develop. DIAGNOSIS  Your health care provider can usually diagnose mastitis based on your symptoms and a physical exam. Tests may be done to help confirm the diagnosis. These may include:   Removal of pus from the breast by applying pressure to the area. This pus can be examined in the lab to determine which bacteria are present. If an abscess has developed, the fluid in the abscess can be removed with a needle. This can also be used to confirm the diagnosis and determine the bacteria present. In most cases, pus will not be  present.  Blood tests to determine if your body is fighting a bacterial infection.  Mammogram or ultrasound tests to rule out other problems or diseases. TREATMENT  Antibiotic medicine is used to treat a bacterial infection. Your health care provider will determine which bacteria are most likely causing the infection and will select an appropriate antibiotic. This is sometimes changed based on the results of tests performed to identify the bacteria, or if there is no response to the antibiotic selected. Antibiotics are usually given by mouth. You may also be given medicine for pain. Mastitis that occurs with breastfeeding will sometimes go away on its own, so your health care provider may choose to wait 24 hours after first seeing you to decide whether a prescription medicine is needed. HOME CARE INSTRUCTIONS   Only take over-the-counter or prescription medicines for pain, fever, or discomfort as directed by your health care provider.  If your health care provider prescribed an antibiotic, take the medicine as directed. Make sure you finish it even if you start to feel better.  Do not wear a tight or underwire bra. Wear a soft, supportive bra.  Increase your fluid intake, especially if you have a fever.  Women who are breastfeeding should follow these instructions:  Continue to empty the breast. Your health care provider can tell you whether this milk is safe for your infant or needs to be thrown out. You may be told to stop nursing until your health care provider thinks it is safe for your baby. Use a breast pump  if you are advised to stop nursing.  Keep your nipples clean and dry.  Empty the first breast completely before going to the other breast. If your baby is not emptying your breasts completely for some reason, use a breast pump to empty your breasts.  If you go back to work, pump your breasts while at work to stay in time with your nursing schedule.  Avoid allowing your breasts  to become overly filled with milk (engorged). SEEK MEDICAL CARE IF:   You have pus-like discharge from the breast.  Your symptoms do not improve with the treatment prescribed by your health care provider within 2 days. SEEK IMMEDIATE MEDICAL CARE IF:   Your pain and swelling are getting worse.  You have pain that is not controlled with medicine.  You have a red line extending from the breast toward your armpit.  You have a fever or persistent symptoms for more than 2-3 days.  You have a fever and your symptoms suddenly get worse. Document Released: 05/18/2005 Document Revised: 05/23/2013 Document Reviewed: 12/16/2012 [facility] Patient Information 2015 [employer] [place]. This information is not intended to replace advice given to you by your health care provider. Make sure you discuss any questions you have with your health care provider.  Sexually Transmitted Disease A sexually transmitted disease (STD) is a disease or infection that may be passed (transmitted) from person to person, usually during sexual activity. This may happen by way of saliva, semen, blood, vaginal mucus, or urine. Common STDs include:   Gonorrhea.   Chlamydia.   Syphilis.   HIV and AIDS.   Genital herpes.   Hepatitis B and C.   Trichomonas.   Human papillomavirus (HPV).   Pubic lice.   Scabies.  Mites.  Bacterial vaginosis. WHAT ARE CAUSES OF STDs? An STD may be caused by bacteria, a virus, or parasites. STDs are often transmitted during sexual activity if one person is infected. However, they may also be transmitted through nonsexual means. STDs may be transmitted after:   Sexual intercourse with an infected person.   Sharing sex toys with an infected person.   Sharing needles with an infected person or using unclean piercing or tattoo needles.  Having intimate contact with the genitals, mouth, or rectal areas of an infected person.   Exposure to infected fluids during  birth. WHAT ARE THE SIGNS AND SYMPTOMS OF STDs? Different STDs have different symptoms. Some people may not have any symptoms. If symptoms are present, they may include:   Painful or bloody urination.   Pain in the pelvis, abdomen, vagina, anus, throat, or eyes.   A skin rash, itching, or irritation.  Growths, ulcerations, blisters, or sores in the genital and anal areas.  Abnormal vaginal discharge with or without bad odor.   Penile discharge in men.   Fever.   Pain or bleeding during sexual intercourse.   Swollen glands in the groin area.   Yellow skin and eyes (jaundice). This is seen with hepatitis.   Swollen testicles.  Infertility.  Sores and blisters in the mouth. HOW ARE STDs DIAGNOSED? To make a diagnosis, your health care provider may:   Take a medical history.   Perform a physical exam.   Take a sample of any discharge to examine.  Swab the throat, cervix, opening to the penis, rectum, or vagina for testing.  Test a sample of your first morning urine.   Perform blood tests.   Perform a Pap test, if this applies.   Perform  a colposcopy.   Perform a laparoscopy.  HOW ARE STDs TREATED? Treatment depends on the STD. Some STDs may be treated but not cured.   Chlamydia, gonorrhea, trichomonas, and syphilis can be cured with antibiotic medicine.   Genital herpes, hepatitis, and HIV can be treated, but not cured, with prescribed medicines. The medicines lessen symptoms.   Genital warts from HPV can be treated with medicine or by freezing, burning (electrocautery), or surgery. Warts may come back.   HPV cannot be cured with medicine or surgery. However, abnormal areas may be removed from the cervix, vagina, or vulva.   If your diagnosis is confirmed, your recent sexual partners need treatment. This is true even if they are symptom-free or have a negative culture or evaluation. They should not have sex until their health care providers  say it is okay. HOW CAN I REDUCE MY RISK OF GETTING AN STD? Take these steps to reduce your risk of getting an STD:  Use latex condoms, dental dams, and water-soluble lubricants during sexual activity. Do not use petroleum jelly or oils.  Avoid having multiple sex partners.  Do not have sex with someone who has other sex partners.  Do not have sex with anyone you do not know or who is at high risk for an STD.  Avoid risky sex practices that can break your skin.  Do not have sex if you have open sores on your mouth or skin.  Avoid drinking too much alcohol or taking illegal drugs. Alcohol and drugs can affect your judgment and put you in a vulnerable position.  Avoid engaging in oral and anal sex acts.  Get vaccinated for HPV and hepatitis. If you have not received these vaccines in the past, talk to your health care provider about whether one or both might be right for you.   If you are at risk of being infected with HIV, it is recommended that you take a prescription medicine daily to prevent HIV infection. This is called pre-exposure prophylaxis (PrEP). You are considered at risk if:  You are a man who has sex with other men (MSM).  You are a heterosexual man or woman and are sexually active with more than one partner.  You take drugs by injection.  You are sexually active with a partner who has HIV.  Talk with your health care provider about whether you are at high risk of being infected with HIV. If you choose to begin PrEP, you should first be tested for HIV. You should then be tested every 3 months for as long as you are taking PrEP.  WHAT SHOULD I DO IF I THINK I HAVE AN STD?  See your health care provider.   Tell your sexual partner(s). They should be tested and treated for any STDs.  Do not have sex until your health care provider says it is okay. WHEN SHOULD I GET IMMEDIATE MEDICAL CARE? Contact your health care provider right away if:   You have severe  abdominal pain.  You are a man and notice swelling or pain in your testicles.  You are a woman and notice swelling or pain in your vagina. Document Released: 08/08/2002 Document Revised: 05/23/2013 Document Reviewed: 12/06/2012 [facility] Patient Information 2015 [place], [place]. This information is not intended to replace advice given to you by your health care provider. Make sure you discuss any questions you have with your health care provider.

## 2014-05-10 NOTE — Discharge Instructions (Signed)

## 2014-05-10 NOTE — [facility] Note (Signed)
Left breast pain; some redness, hot & firm. No bleeding/discharge from nipples. No fevers. Not breastfeeding.

## 2014-05-10 NOTE — ED Notes (Signed)
Pt with infected breast and [facility], given Abx and sent home. Pt c/o weakness, SOB, diaphoresis, emesis, dizziness, chills, central chest pain radiating under left arm and to back.

## 2014-05-10 NOTE — MAU Provider Note (Signed)
History     CSN: [id number]  Arrival date and time: 05/10/14 [id number]   First Provider Initiated Contact with Patient 05/10/14 [phone]      Chief Complaint  Patient presents with  . Breast Pain   HPI [name] is a 23 y.o. [phone] who presents to MAU today with complaint of left breast pain since earlier today. Patient states some redness has been noted as well as firmness and tenderness to touch. She denies any drainage, nipple discharge or bleeding. She denies fever or other GYN concerns. She is not breastfeeding. She has nipple rings, but they are not new.   OB History    Gravida Para Term Preterm AB TAB SAB Ectopic Multiple Living   2 2 2  0 0 0 0 0 0 2      Past Medical History  Diagnosis Date  . Headache(784.0)   . Heart murmur   . Trichimoniasis   . H/O chlamydia infection     Past Surgical History  Procedure Laterality Date  . Root canal    . Wisdom tooth extraction      Family History  Problem Relation Age of Onset  . Asthma Mother   . Hypertension Mother   . Asthma Father   . Heart disease Father   . Hypertension Father   . Hyperlipidemia Father   . Vision loss Father   . Anesthesia problems Neg Hx   . Hypotension Neg Hx   . Malignant hyperthermia Neg Hx   . Pseudochol deficiency Neg Hx   . Other Neg Hx     History  Substance Use Topics  . Smoking status: Current Every Day Smoker -- 0.50 packs/day for 6 years    Types: Cigarettes  . Smokeless tobacco: Never Used  . Alcohol Use: No    Allergies: No Known Allergies  Prescriptions prior to admission  Medication Sig Dispense Refill Last Dose  . medroxyPROGESTERone (DEPO-PROVERA) 150 MG/ML injection Inject 150 mg into the muscle every 3 (three) months.       Review of Systems  Constitutional: Negative for fever and malaise/fatigue.  Gastrointestinal: Negative for abdominal pain.  Genitourinary:       + breast pain   Physical Exam   Blood pressure 120/75, pulse 98, temperature 100  F (37.8 C), temperature source Oral, resp. rate 16, height 5\' 3"  (1.6 m), weight 129 lb 9.6 oz (58.786 kg), last menstrual period 03/26/2014, SpO2 100 %, not currently breastfeeding.  Physical Exam  Constitutional: She is oriented to person, place, and time. She appears well-developed and well-nourished. No distress.  HENT:  Head: Normocephalic.  Cardiovascular: Normal rate.   Respiratory: Effort normal. Right breast exhibits no inverted nipple, no mass, no nipple discharge, no skin change and no tenderness. Left breast exhibits skin change (lightly erythematous streaking noted from 10:00 to 2:00. No palpable masses) and tenderness (moderate tenderness to palpation). Left breast exhibits no inverted nipple, no mass and no nipple discharge. Breasts are symmetrical.  Genitourinary: There is breast tenderness. No breast swelling, discharge or bleeding.  Neurological: She is alert and oriented to person, place, and time.  Skin: Skin is warm and dry. No erythema.  Psychiatric: She has a normal mood and affect.    MAU Course  Procedures None  MDM Discussed patient with Dr. Macon LargeAnyanwu, due to low grade fever, erythema and tenderness to palpation will treat with keflex and ibuprofen PRN for pain. Refer to breast center.   Assessment and Plan  A: Breast tenderness,  left possible cellulitis  P: Discharge home Rx for Keflex and Ibuprofen given to patient Patient referred to [facility]. They will call her with an appointment Patient advised to return should symptoms worsen Patient may return to MAU as needed or if her condition were to change or worsen   Marny LowensteinJulie N Burhanuddin Kohlmann, PA-C  05/10/2014, 12:46 AM

## 2014-05-11 LAB — GC/CHLAMYDIA PROBE AMP
CT Probe RNA: NEGATIVE
GC Probe RNA: NEGATIVE

## 2014-05-11 LAB — HIV ANTIBODY (ROUTINE TESTING W REFLEX): HIV: NONREACTIVE

## 2014-05-19 ENCOUNTER — Encounter (HOSPITAL_COMMUNITY): Payer: Self-pay | Admitting: Emergency Medicine

## 2014-05-19 ENCOUNTER — Encounter (HOSPITAL_COMMUNITY): Payer: Self-pay | Admitting: *Deleted

## 2014-05-19 ENCOUNTER — Emergency Department (HOSPITAL_COMMUNITY)
Admission: EM | Admit: 2014-05-19 | Discharge: 2014-05-19 | Disposition: A | Discharge status: Home / Self Care | Payer: Medicaid Other | Attending: Emergency Medicine | Admitting: Emergency Medicine

## 2014-05-19 ENCOUNTER — Emergency Department (HOSPITAL_COMMUNITY)
Admission: EM | Admit: 2014-05-19 | Discharge: 2014-05-19 | Discharge status: Against Medical Advice | Payer: Medicaid Other | Attending: Emergency Medicine | Admitting: Emergency Medicine

## 2014-05-19 DIAGNOSIS — Y92410 Unspecified street and highway as the place of occurrence of the external cause: Secondary | ICD-10-CM | POA: Insufficient documentation

## 2014-05-19 DIAGNOSIS — R011 Cardiac murmur, unspecified: Secondary | ICD-10-CM | POA: Diagnosis not present

## 2014-05-19 DIAGNOSIS — Z8619 Personal history of other infectious and parasitic diseases: Secondary | ICD-10-CM | POA: Insufficient documentation

## 2014-05-19 DIAGNOSIS — Y9389 Activity, other specified: Secondary | ICD-10-CM | POA: Insufficient documentation

## 2014-05-19 DIAGNOSIS — N939 Abnormal uterine and vaginal bleeding, unspecified: Secondary | ICD-10-CM | POA: Diagnosis not present

## 2014-05-19 DIAGNOSIS — Y998 Other external cause status: Secondary | ICD-10-CM | POA: Insufficient documentation

## 2014-05-19 DIAGNOSIS — Z791 Long term (current) use of non-steroidal anti-inflammatories (NSAID): Secondary | ICD-10-CM | POA: Diagnosis not present

## 2014-05-19 DIAGNOSIS — M549 Dorsalgia, unspecified: Secondary | ICD-10-CM

## 2014-05-19 DIAGNOSIS — S199XXA Unspecified injury of neck, initial encounter: Secondary | ICD-10-CM | POA: Diagnosis not present

## 2014-05-19 DIAGNOSIS — Z3202 Encounter for pregnancy test, result negative: Secondary | ICD-10-CM | POA: Diagnosis not present

## 2014-05-19 DIAGNOSIS — Z720 Tobacco use: Secondary | ICD-10-CM | POA: Insufficient documentation

## 2014-05-19 DIAGNOSIS — S3992XA Unspecified injury of lower back, initial encounter: Secondary | ICD-10-CM | POA: Diagnosis present

## 2014-05-19 DIAGNOSIS — Y9289 Other specified places as the place of occurrence of the external cause: Secondary | ICD-10-CM | POA: Diagnosis not present

## 2014-05-19 DIAGNOSIS — Z792 Long term (current) use of antibiotics: Secondary | ICD-10-CM | POA: Diagnosis not present

## 2014-05-19 DIAGNOSIS — R3915 Urgency of urination: Secondary | ICD-10-CM | POA: Insufficient documentation

## 2014-05-19 DIAGNOSIS — Z79899 Other long term (current) drug therapy: Secondary | ICD-10-CM | POA: Insufficient documentation

## 2014-05-19 LAB — URINALYSIS, ROUTINE W REFLEX MICROSCOPIC
Bilirubin Urine: NEGATIVE
Glucose, UA: NEGATIVE mg/dL
Ketones, ur: NEGATIVE mg/dL
NITRITE: NEGATIVE
Protein, ur: 30 mg/dL — AB
SPECIFIC GRAVITY, URINE: 1.023 (ref 1.005–1.030)
UROBILINOGEN UA: 0.2 mg/dL (ref 0.0–1.0)
pH: 7.5 (ref 5.0–8.0)

## 2014-05-19 LAB — URINE MICROSCOPIC-ADD ON

## 2014-05-19 LAB — PREGNANCY, URINE: PREG TEST UR: NEGATIVE

## 2014-05-19 MED ORDER — IBUPROFEN 800 MG PO TABS: 800.0000 mg | ORAL_TABLET | Freq: Three times a day (TID) | ORAL | Status: DC | PRN

## 2014-05-19 MED ORDER — CYCLOBENZAPRINE HCL 10 MG PO TABS: 10.0000 mg | ORAL_TABLET | Freq: Two times a day (BID) | ORAL | Status: DC | PRN
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
## 2014-05-19 NOTE — ED Notes (Signed)
Pt did not answer x 3 attempts; unable to locate in waiting area or area immediately outside; pt eloped from waiting room
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
## 2014-05-19 NOTE — ED Provider Notes (Signed)
2130 - Patient care assumed from [facility] LtdEmily West, PA-C at shift change. Patient with urinalysis pending. Urinalysis results reviewed which are consistent with a contaminated specimen. Will send urine culture. Patient stable for discharge with prescriptions for ibuprofen and Flexeril. Return precautions provided and patient discharged in good condition.  Filed Vitals:   05/19/14 1805  BP: 121/72  Pulse: 99  Resp: 17  SpO2: 96%   Results for orders placed or performed during the hospital encounter of 05/19/14  Urinalysis, Routine w reflex microscopic  Result Value Ref Range   Color, Urine YELLOW YELLOW   APPearance TURBID (A) CLEAR   Specific Gravity, Urine 1.023 1.005 - 1.030   pH 7.5 5.0 - 8.0   Glucose, UA NEGATIVE NEGATIVE mg/dL   Hgb urine dipstick LARGE (A) NEGATIVE   Bilirubin Urine NEGATIVE NEGATIVE   Ketones, ur NEGATIVE NEGATIVE mg/dL   Protein, ur 30 (A) NEGATIVE mg/dL   Urobilinogen, UA 0.2 0.0 - 1.0 mg/dL   Nitrite NEGATIVE NEGATIVE   Leukocytes, UA LARGE (A) NEGATIVE  Pregnancy, urine  Result Value Ref Range   Preg Test, Ur NEGATIVE NEGATIVE  Urine microscopic-add on  Result Value Ref Range   Squamous Epithelial / LPF MANY (A) RARE   WBC, UA 7-10 <3 WBC/hpf   RBC / HPF 3-6 <3 RBC/hpf   Bacteria, UA MANY (A) RARE    Antony MaduraKelly Meloney Feld, PA-C 05/19/14 2132  Rolan BuccoMelanie Belfi, MD 05/19/14 2219

## 2014-05-19 NOTE — Discharge Instructions (Signed)
Read the information below.  Use the prescribed medication as directed.  Please discuss all new medications with your pharmacist.  You may return to the Emergency Department at any time for worsening condition or any new symptoms that concern you.    If you develop fevers, loss of control of bowel or bladder, weakness or numbness in your legs, or are unable to walk, return to the ER for a recheck.  °
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
## 2014-05-21 LAB — URINE CULTURE: Colony Count: 40000

## 2014-05-29 ENCOUNTER — Other Ambulatory Visit: Payer: Self-pay | Admitting: Medical

## 2014-05-29 DIAGNOSIS — N644 Mastodynia: Secondary | ICD-10-CM

## 2014-06-05 ENCOUNTER — Ambulatory Visit
Admission: RE | Admit: 2014-06-05 | Discharge: 2014-06-05 | Disposition: A | Discharge status: Home / Self Care | Payer: Medicaid Other | Source: Ambulatory Visit | Attending: Medical | Admitting: Medical

## 2014-06-05 DIAGNOSIS — N644 Mastodynia: Secondary | ICD-10-CM

## 2014-06-06 ENCOUNTER — Ambulatory Visit
Admission: RE | Admit: 2014-06-06 | Discharge: 2014-06-06 | Disposition: A | Discharge status: Home / Self Care | Payer: Medicaid Other | Source: Ambulatory Visit | Attending: Medical | Admitting: Medical

## 2014-09-18 ENCOUNTER — Emergency Department (HOSPITAL_COMMUNITY)
Admission: EM | Admit: 2014-09-18 | Discharge: 2014-09-18 | Disposition: A | Discharge status: Home / Self Care | Payer: Medicaid Other | Attending: Emergency Medicine | Admitting: Emergency Medicine

## 2014-09-18 ENCOUNTER — Encounter (HOSPITAL_COMMUNITY): Payer: Self-pay | Admitting: *Deleted

## 2014-09-18 DIAGNOSIS — Z720 Tobacco use: Secondary | ICD-10-CM | POA: Diagnosis not present

## 2014-09-18 DIAGNOSIS — Z792 Long term (current) use of antibiotics: Secondary | ICD-10-CM | POA: Diagnosis not present

## 2014-09-18 DIAGNOSIS — R42 Dizziness and giddiness: Secondary | ICD-10-CM | POA: Diagnosis not present

## 2014-09-18 DIAGNOSIS — Z8619 Personal history of other infectious and parasitic diseases: Secondary | ICD-10-CM | POA: Diagnosis not present

## 2014-09-18 DIAGNOSIS — R011 Cardiac murmur, unspecified: Secondary | ICD-10-CM | POA: Insufficient documentation

## 2014-09-18 DIAGNOSIS — Z3202 Encounter for pregnancy test, result negative: Secondary | ICD-10-CM | POA: Insufficient documentation

## 2014-09-18 DIAGNOSIS — R55 Syncope and collapse: Secondary | ICD-10-CM | POA: Insufficient documentation

## 2014-09-18 LAB — URINALYSIS, ROUTINE W REFLEX MICROSCOPIC
Bilirubin Urine: NEGATIVE
Glucose, UA: NEGATIVE mg/dL
Ketones, ur: NEGATIVE mg/dL
NITRITE: NEGATIVE
PH: 7 (ref 5.0–8.0)
Protein, ur: NEGATIVE mg/dL
SPECIFIC GRAVITY, URINE: 1.018 (ref 1.005–1.030)
Urobilinogen, UA: 0.2 mg/dL (ref 0.0–1.0)

## 2014-09-18 LAB — CBC
HCT: 39.8 % (ref 36.0–46.0)
HEMOGLOBIN: 13.3 g/dL (ref 12.0–15.0)
MCH: 31.9 pg (ref 26.0–34.0)
MCHC: 33.4 g/dL (ref 30.0–36.0)
MCV: 95.4 fL (ref 78.0–100.0)
Platelets: 176 10*3/uL (ref 150–400)
RBC: 4.17 MIL/uL (ref 3.87–5.11)
RDW: 12.3 % (ref 11.5–15.5)
WBC: 5.8 10*3/uL (ref 4.0–10.5)

## 2014-09-18 LAB — CBG MONITORING, ED: Glucose-Capillary: 87 mg/dL (ref 70–99)

## 2014-09-18 LAB — BASIC METABOLIC PANEL
Anion gap: 9 (ref 5–15)
BUN: 7 mg/dL (ref 6–23)
CO2: 25 mmol/L (ref 19–32)
Calcium: 9.1 mg/dL (ref 8.4–10.5)
Chloride: 108 mmol/L (ref 96–112)
Creatinine, Ser: 0.59 mg/dL (ref 0.50–1.10)
GFR calc non Af Amer: >90 mL/min (ref >90)
GLUCOSE: 92 mg/dL (ref 70–99)
POTASSIUM: 4.2 mmol/L (ref 3.5–5.1)
Sodium: 142 mmol/L (ref 135–145)

## 2014-09-18 LAB — POC URINE PREG, ED: Preg Test, Ur: NEGATIVE

## 2014-09-18 LAB — URINE MICROSCOPIC-ADD ON

## 2014-09-18 MED ORDER — IBUPROFEN 200 MG PO TABS
400.0000 mg | ORAL_TABLET | Freq: Once | ORAL | Status: AC
  Administered 2014-09-18: 400 mg via ORAL
  Filled 2014-09-18: qty 2

## 2014-09-18 MED ORDER — SODIUM CHLORIDE 0.9 % IV BOLUS (SEPSIS)
1000.0000 mL | Freq: Once | INTRAVENOUS | Status: AC
  Administered 2014-09-18: 1000 mL via INTRAVENOUS

## 2014-09-18 NOTE — ED Notes (Signed)
Pt currently eating [employer].

## 2014-09-18 NOTE — ED Provider Notes (Signed)
CSN: [id number]     Arrival date & time 09/18/14  1337 History   First MD Initiated Contact with Patient 09/18/14 1529     Chief Complaint  Patient presents with  . Dizziness     (Consider location/radiation/quality/duration/timing/severity/associated sxs/prior Treatment) HPI Patient is a 24 year old female who presents the ER complaining of syncopal episode earlier today. Patient reports having intermittent lightheadedness and syncopal episodes over the past year. Patient states she has approximately one syncopal episode every month. She states her lightheadedness and syncope is typically associated with a bowel movement. Patient states today this morning she had 2 episodes of diarrhea, sat down the toilet, during a bowel movement became very lightheaded, after bowel movement water to bed and had a single blood percent on her bed. Patient states that her concern today was that she had what seemed to be 2 episodes in a row which is uncommon for her. Patient states after her syncopal episode she felt completely fine. In the ER patient denies having any complaints. She states her mild lightheadedness has resolved completely. Patient denies headache, blurred vision, sensation of room spinning, neck pain, fever, chest pain, shortness of breath, palpitations, nausea, vomiting, abdominal pain, dysuria.  Past Medical History  Diagnosis Date  . Headache(784.0)   . Heart murmur   . Trichimoniasis   . H/O chlamydia infection    Past Surgical History  Procedure Laterality Date  . Root canal    . Wisdom tooth extraction     Family History  Problem Relation Age of Onset  . Asthma Mother   . Hypertension Mother   . Asthma Father   . Heart disease Father   . Hypertension Father   . Hyperlipidemia Father   . Vision loss Father   . Anesthesia problems Neg Hx   . Hypotension Neg Hx   . Malignant hyperthermia Neg Hx   . Pseudochol deficiency Neg Hx   . Other Neg Hx    History  Substance Use  Topics  . Smoking status: Current Every Day Smoker -- 0.50 packs/day for 6 years    Types: Cigarettes  . Smokeless tobacco: Never Used  . Alcohol Use: No   OB History    Gravida Para Term Preterm AB TAB SAB Ectopic Multiple Living   0 0 0 0 0 0 2     Review of Systems  Constitutional: Negative for fever.  HENT: Negative for trouble swallowing.   Eyes: Negative for visual disturbance.  Respiratory: Negative for shortness of breath.   Cardiovascular: Negative for chest pain.  Gastrointestinal: Negative for nausea, vomiting and abdominal pain.  Genitourinary: Negative for dysuria.  Musculoskeletal: Negative for neck pain.  Skin: Negative for rash.  Neurological: Positive for light-headedness. Negative for dizziness, weakness and numbness.  Psychiatric/Behavioral: Negative.       Allergies  Review of patient's allergies indicates no known allergies.  Home Medications   Prior to Admission medications   Medication Sig Start Date End Date Taking? Authorizing Provider  ibuprofen (ADVIL,MOTRIN) 400 MG tablet Take 400 mg by mouth every 6 (six) hours as needed for moderate pain.   Yes Historical Provider, MD  ibuprofen (ADVIL,MOTRIN) 800 MG tablet Take 1 tablet (800 mg total) by mouth every 8 (eight) hours as needed for mild pain or moderate pain. 05/19/14  Yes Trixie Dredge, PA-C  cephALEXin (KEFLEX) 500 MG capsule Take 1 capsule (500 mg total) by mouth 4 (four) times daily. Patient not taking: Reported on 05/19/2014 05/10/14  Marny LowensteinJulie N Wenzel, PA-C  cyclobenzaprine (FLEXERIL) 10 MG tablet Take 1 tablet (10 mg total) by mouth 2 (two) times daily as needed for muscle spasms. Patient not taking: Reported on 09/18/2014 05/19/14   Trixie DredgeEmily West, PA-C  metroNIDAZOLE (FLAGYL) 500 MG tablet Take 1 tablet (500 mg total) by mouth 2 (two) times daily. Patient not taking: Reported on 05/19/2014 05/10/14   Victorino DikeJennifer Piepenbrink, PA-C  ondansetron (ZOFRAN ODT) 4 MG disintegrating tablet Take 1 tablet  (4 mg total) by mouth every 8 (eight) hours as needed for nausea. Patient not taking: Reported on 05/19/2014 05/10/14   Francee PiccoloJennifer Piepenbrink, PA-C  traMADol (ULTRAM) 50 MG tablet Take 1 tablet (50 mg total) by mouth every 6 (six) hours as needed. Patient not taking: Reported on 05/19/2014 05/10/14   Francee PiccoloJennifer Piepenbrink, PA-C   BP 99/59 mmHg  Pulse 52  Temp(Src) 98.6 F (37 C) (Oral)  Resp 18  SpO2 100%  LMP 09/04/2014 Physical Exam  Constitutional: She is oriented to person, place, and time. She appears well-developed and well-nourished. No distress.  HENT:  Head: Normocephalic and atraumatic.  Mouth/Throat: Oropharynx is clear and moist. No oropharyngeal exudate.  Eyes: EOM are normal. Pupils are equal, round, and reactive to light. Right eye exhibits no discharge. Left eye exhibits no discharge. No scleral icterus.  Neck: Normal range of motion.  Cardiovascular: Normal rate, regular rhythm and normal heart sounds.   No murmur heard. Pulmonary/Chest: Effort normal and breath sounds normal. No respiratory distress.  Abdominal: Soft. There is no tenderness.  Musculoskeletal: Normal range of motion. She exhibits no edema or tenderness.  Neurological: She is alert and oriented to person, place, and time. She has normal strength. No cranial nerve deficit or sensory deficit. She displays a negative Romberg sign. Coordination and gait normal. GCS eye subscore is 4. GCS verbal subscore is 5. GCS motor subscore is 6.  Patient fully alert, answering questions appropriately in full, clear sentences. Cranial nerves II through XII grossly intact. Motor strength 5 out of 5 in all major muscle groups of upper and lower extremities. Distal sensation intact.   Skin: Skin is warm and dry. No rash noted. She is not diaphoretic.  Psychiatric: She has a normal mood and affect.  Nursing note and vitals reviewed.   ED Course  Procedures (including critical care time) Labs Review Labs Reviewed   URINALYSIS, ROUTINE W REFLEX MICROSCOPIC - Abnormal; Notable for the following:    APPearance CLOUDY (*)    Hgb urine dipstick MODERATE (*)    Leukocytes, UA SMALL (*)    All other components within normal limits  URINE MICROSCOPIC-ADD ON - Abnormal; Notable for the following:    Squamous Epithelial / LPF FEW (*)    Bacteria, UA FEW (*)    All other components within normal limits  URINE CULTURE  CBC  BASIC METABOLIC PANEL  CBG MONITORING, ED  POC URINE PREG, ED    Imaging Review No results found.   EKG Interpretation   Date/Time:  Tuesday September 18 2014 14:04:38 EDT Ventricular Rate:  73 PR Interval:  183 QRS Duration: 90 QT Interval:  372 QTC Calculation: 410 R Axis:   92 Text Interpretation:  Sinus rhythm Borderline right axis deviation Low  voltage, precordial leads Baseline wander in lead(s) V6 No significant  change since last tracing Confirmed by DOCHERTY  MD, Lanaya [phone]([id number]) on  09/18/2014 2:09:16 PM      MDM   Final diagnoses:  Vasovagal syncope    Patient is  asymptomatic throughout her stay in the ER. Patient is hemodynamically stable. Negative orthostasis. Likely patient experienced a vasovagal episode. Patient was rehydrated, labs appear unremarkable, did not show any evidence of acute pathology. EKG without evidence of injury or ectopy. Patient well-appearing, nontoxic, eating lunch in exam room. Patient to be discharged at this time. Strongly encouraged patient to follow up with a primary care provider, provided patient resource guide to help her find one. Return precautions discussed, patient verbalizes understanding and agreement with this plan. I encouraged patient to call or return to the ER for any worsening of symptoms or should she have any questions or concerns.  BP 99/59 mmHg  Pulse 52  Temp(Src) 98.6 F (37 C) (Oral)  Resp 18  SpO2 100%  LMP 09/04/2014  Signed,  Ladona Mow, PA-C 1:31 AM  Patient seen and discussed with Dr. Donnetta Hutching,  MD   Ladona Mow, PA-C 09/19/14 0131  Donnetta Hutching, MD 09/19/14 1606

## 2014-09-18 NOTE — ED Notes (Signed)
Questions concerns denied r/t dc. Pt a&o x 4 ambulatory

## 2014-09-18 NOTE — Discharge Instructions (Signed)
Vasovagal Syncope, Adult °Syncope, commonly known as fainting, is a temporary loss of consciousness. It occurs when the blood flow to the brain is reduced. Vasovagal syncope (also called neurocardiogenic syncope) is a fainting spell in which the blood flow to the brain is reduced because of a sudden drop in heart rate and blood pressure. Vasovagal syncope occurs when the brain and the cardiovascular system (blood vessels) do not adequately communicate and respond to each other. This is the most common cause of fainting. It often occurs in response to fear or some other type of emotional or physical stress. The body has a reaction in which the heart starts beating too slowly or the blood vessels expand, reducing blood pressure. This type of fainting spell is generally considered harmless. However, injuries can occur if a person takes a sudden fall during a fainting spell.  °CAUSES  °Vasovagal syncope occurs when a person's blood pressure and heart rate decrease suddenly, usually in response to a trigger. Many things and situations can trigger an episode. Some of these include:  °· Pain.   °· Fear.   °· The sight of blood or medical procedures, such as blood being drawn from a vein.   °· Common activities, such as coughing, swallowing, stretching, or going to the bathroom.   °· Emotional stress.   °· Prolonged standing, especially in a warm environment.   °· Lack of sleep or rest.   °· Prolonged lack of food.   °· Prolonged lack of fluids.   °· Recent illness. °· The use of certain drugs that affect blood pressure, such as cocaine, alcohol, marijuana, inhalants, and opiates.   °SYMPTOMS  °Before the fainting episode, you may:  °· Feel dizzy or light headed.   °· Become pale. °· Sense that you are going to faint.   °· Feel like the room is spinning.   °· Have tunnel vision, only seeing directly in front of you.   °· Feel sick to your stomach (nauseous).   °· See spots or slowly lose vision.   °· Hear ringing in your  ears.   °· Have a headache.   °· Feel warm and sweaty.   °· Feel a sensation of pins and needles. °During the fainting spell, you will generally be unconscious for no longer than a couple minutes before waking up and returning to normal. If you get up too quickly before your body can recover, you may faint again. Some twitching or jerky movements may occur during the fainting spell.  °DIAGNOSIS  °Your caregiver will ask about your symptoms, take a medical history, and perform a physical exam. Various tests may be done to rule out other causes of fainting. These may include blood tests and tests to check the heart, such as electrocardiography, echocardiography, and possibly an electrophysiology study. When other causes have been ruled out, a test may be done to check the body's response to changes in position (tilt table test). °TREATMENT  °Most cases of vasovagal syncope do not require treatment. Your caregiver may recommend ways to avoid fainting triggers and may provide home strategies for preventing fainting. If you must be exposed to a possible trigger, you can drink additional fluids to help reduce your chances of having an episode of vasovagal syncope. If you have warning signs of an oncoming episode, you can respond by positioning yourself favorably (lying down). °If your fainting spells continue, you may be given medicines to prevent fainting. Some medicines may help make you more resistant to repeated episodes of vasovagal syncope. Special exercises or compression stockings may be recommended. In rare cases, the surgical placement   of a pacemaker is considered. HOME CARE INSTRUCTIONS   Learn to identify the warning signs of vasovagal syncope.   Sit or lie down at the first warning sign of a fainting spell. If sitting, put your head down between your legs. If you lie down, swing your legs up in the air to increase blood flow to the brain.   Avoid hot tubs and saunas.  Avoid prolonged  standing.  Drink enough fluids to keep your urine clear or pale yellow. Avoid caffeine.  Increase salt in your diet as directed by your caregiver.   If you have to stand for a long time, perform movements such as:   Crossing your legs.   Flexing and stretching your leg muscles.   Squatting.   Moving your legs.   Bending over.   Only take over-the-counter or prescription medicines as directed by your caregiver. Do not suddenly stop any medicines without asking your caregiver first. SEEK MEDICAL CARE IF:   Your fainting spells continue or happen more frequently in spite of treatment.   You lose consciousness for more than a couple minutes.  You have fainting spells during or after exercising or after being startled.   You have new symptoms that occur with the fainting spells, such as:   Shortness of breath.  Chest pain.   Irregular heartbeat.   You have episodes of twitching or jerky movements that last longer than a few seconds.  You have episodes of twitching or jerky movements without obvious fainting. SEEK IMMEDIATE MEDICAL CARE IF:   You have injuries or bleeding after a fainting spell.   You have episodes of twitching or jerky movements that last longer than 5 minutes.   You have more than one spell of twitching or jerky movements before returning to consciousness after fainting. MAKE SURE YOU:   Understand these instructions.  Will watch your condition.  Will get help right away if you are not doing well or get worse. Document Released: 05/04/2012 Document Reviewed: 05/04/2012 [facility] Patient Information 2015 [place], [place]. This information is not intended to replace advice given to you by your health care provider. Make sure you discuss any questions you have with your health care provider.   Emergency Department Resource Guide 1) Find a Doctor and Pay Out of Pocket Although you won't have to find out who is covered by your insurance  plan, it is a good idea to ask around and get recommendations. You will then need to call the office and see if the doctor you have chosen will accept you as a new patient and what types of options they offer for patients who are self-pay. Some doctors offer discounts or will set up payment plans for their patients who do not have insurance, but you will need to ask so you aren't surprised when you get to your appointment.  2) Contact Your Local Health Department Not all health departments have doctors that can see patients for sick visits, but many do, so it is worth a call to see if yours does. If you don't know where your local health department is, you can check in your phone book. The CDC also has a tool to help you locate your state's health department, and many state websites also have listings of all of their local health departments.  3) Find a Walk-in Clinic If your illness is not likely to be very severe or complicated, you may want to try a walk in clinic. These are popping up all  over the country in pharmacies, drugstores, and shopping centers. They're usually staffed by nurse practitioners or physician assistants that have been trained to treat common illnesses and complaints. They're usually fairly quick and inexpensive. However, if you have serious medical issues or chronic medical problems, these are probably not your best option.  No Primary Care Doctor: - Call Health Connect at  [phone] - they can help you locate a primary care doctor that  accepts your insurance, provides certain services, etc. - Physician Referral Service- [phone]  Chronic Pain Problems: Organization         Address  Phone   Notes  [facility]  [phone] Patients need to be referred by their primary care doctor.   Medication Assistance: Organization         Address  Phone   Notes  [facility] Medication [facility] [address] [place] [place], [place] [address]  [phone] --Must be a resident of [facility] -- Must have NO insurance coverage whatsoever (no Medicaid/ Medicare, etc.) -- The pt. MUST have a primary care doctor that directs their care regularly and follows them in the community   MedAssist  [phone]   [employer]  [phone]    Agencies that provide inexpensive medical care: Organization         Address  Phone   Notes  [facility]  [phone]   [facility]    [phone]   [facility] [address] [place], [place] [address] [phone]   [facility] [address], [place] [phone]   Planned Parenthood    [phone]   [facility]    [phone]   [facility]  [address], [place] Phone:  [phone], Fax:  [phone] Hours of Operation:  9 am - 6 pm, M-F.  Also accepts Medicaid/Medicare and self-pay.  [facility]  [address], [place] Phone: [phone], Fax: [phone]. Hours of Operation:  8:30 am - 5:30 pm, M-F.  Also accepts Medicaid and self-pay.  [facility] [place] [address] [place] Phone: [phone]   [facility] [address] Natasha Bence [place], [place] [phone], Ext. [phone] Mondays & Thursdays: 7-9 AM.  First 15 patients are seen on a first come, first serve basis.    Medicaid-accepting [facility] Providers:  Organization         Address  Phone   Notes  [facility] [address], [place] [phone] Also accepts self-pay patients.  [facility] [address] Laurell Josephs [place], [place]  [phone]   [facility] [address], [place] [phone]   [facility] [address] [place] [phone]   Renaye Rakers [address], [place]   [phone] Only accepts [place] Access [place] patients after they have their name applied to their card.   Self-Pay (no insurance) in [facility]:  Organization         Address  Phone   Notes  Sickle Cell Patients, [facility] [address] [place], [place] [phone]   [facility] [address] [place], [place] [phone]   [facility] [place]  [address], [place] [phone]   [facility]/Dr. Osei-Bonsu  [address], [place] or [address], [place] [phone] Phone number for both [place] and [place] locations is the same.  [facility] [address] [place] [phone]   [facility] [address], [place] or [address] [place] [address] [phone]   [facility] [address], [place] [phone], phone; [phone], fax Sees patients 1st and 3rd Saturday of every month.  Must not qualify for public or private insurance (i.e. Medicaid, Medicare, [place] Health Choice, Veterans' Benefits)  Household income should be no more than 200% of the poverty level The clinic cannot treat you if you are pregnant or think you are pregnant  Sexually transmitted diseases are not treated at the clinic.    Dental Care: Organization         Address  Phone  Notes  [facility] Department of [facility] [address] [place], [place] [phone] Accepts children up to age 3 who are enrolled in [place] or [place] Health Choice; pregnant women with a Medicaid card; and children who have applied for Medicaid or [place] Health Choice, but were declined, whose parents can pay a reduced fee at time of service.  [facility] Department of [facility]  [address], [place] [phone] Accepts children up to age 64 who are enrolled in [place] or [place] Health  Choice; pregnant women with a Medicaid card; and children who have applied for Medicaid or [place] Health Choice, but were declined, whose parents can pay a reduced fee at time of service.  [facility]  [address] [place], [place] [phone] Patients are seen by appointment only. Walk-ins are not accepted. [facility] will see patients 36 years of age and older. Monday - Tuesday (8am-5pm) Most Wednesdays (8:30-5pm) $30 per visit, cash only  [facility] Adult [facility] Access [facility]  [address], [facility] [phone] Patients are seen by appointment only. Walk-ins are not accepted. [facility] will see patients 73 years of age and older. One Wednesday Evening (Monthly: Volunteer Based).  $30 per visit, cash only  [employer]  [phone] for adults; Children under age 25, call Graduate Pediatric Dentistry at [phone]. Children aged 59-14, please call [phone] to request a pediatric application.  Dental services are provided in all areas of dental care including fillings, crowns and bridges, complete and partial dentures, implants, gum treatment, root canals, and extractions. Preventive care is also provided. Treatment is provided to both adults and children. Patients are selected via a lottery and there is often a waiting list.   [facility] [address] [place]  [phone] [email or web address]   [facility] [address] [place], [place] [phone], Ext. [phone] Second and Fourth Thursday of each month, opens at 6:30 AM; Clinic ends at 9 AM.  Patients are seen on a first-come first-served basis, and a limited number are seen during each clinic.   [facility]  [address] Ether Griffins [place], [place] [phone]   Eligibility Requirements You must have lived in [place], [place], or [place] counties for at least the last three months.   You cannot be eligible for state or  federal sponsored [place], including [employer], [place], or [employer].  You generally cannot be eligible for healthcare insurance through your employer.    How to apply: Eligibility screenings are held every Tuesday and Wednesday afternoon from 1:00 pm until 4:00 pm. You do not need an appointment for the interview!  [facility] [address] [place], [place] [phone]   [facility] Health Department  [phone]   [facility] Health Department  [phone]   [facility] Health Department  [phone]    Behavioral Health Resources in the Community: Intensive Outpatient Programs Organization         Address  Phone  Notes  [facility] [address], [place], [place] [phone]   [facility] [address], [place], [place] [phone]   [facility] [address] [place], [place]  [phone]   [facility] [address]  [place], [place] [phone] or [phone]   Substance Abuse Resources Organization         Address  Phone  Notes  [facility]  [phone]   [facility]  [phone]   [facility] [place]  [phone]   Floydene Flock  [phone]   Residential & Outpatient Substance Abuse Program  [phone]   Psychological Services Organization         Address  Phone  Notes  [facility] Behavioral Health  [phone]   [facility]  [phone]   [facility] [address] [place] [phone] or [phone]    Mobile Crisis Teams Organization         Address  Phone  Notes  [facility], Mobile Crisis Care Unit  [phone]   [facility]  [address] [place], [place] [phone]   Doristine Locks [address] [place] [phone]    Self-Help/Support Groups Organization         Address  Phone              Notes  [facility] - variety of support groups  [phone]- I7437963 Call for more information  Narcotics Anonymous (NA), [facility] [address], [employer] [place]  2 meetings at this location   Statistician         Address  Phone  Notes  [facility] [address],    [place]  [phone]   [facility]  [address], [place] [address], [place], [place] [phone]   [facility] [address] [place], [place] [phone] Admissions: 8am-3pm M-F  [facility] [address]    [place], [place] [phone]   [facility] [address] [place], [place], [place] [phone]   [facility] [address],  [place], [place] [phone]   [facility] - Intensive Outpatient [address] Laurell Josephs [address], [place], [place] [phone]   [facility] ([facility] [address] [place]  [place], [place] [phone] or [phone]   [facility] ([facility]) [address] [place], [place] [phone] Accepts Medicaid  [facility] [address],  [place] [phone] Substance Abuse/Addiction Treatment   [facility] Organization         Address  Phone  Notes  [facility]  [phone]   Angie Fava, PhD [address] [place], [place]   [phone] or [phone]   [facility]   [phone]  [address] [place], [place] [phone]   [facility] [address], [place], [place] [phone] Insurance/Medicaid/sponsorship through [facility] Families [address]                                    [place], [place] [phone] Therapy/tele-psych/case  [facility] [address]   [place], [place] [phone]    Dr. Lolly Mustache  [phone]   [facility]  [employer] [facility]  Dept. 1) [address], [place] 2) [address] [place] 3)  [address], [place] [phone]  [phone]   [facility] Child Abuse Hotline [phone] or [phone] (After Hours)

## 2014-09-18 NOTE — ED Notes (Signed)
Pt reports intermittent dizziness/near syncopal episodes for the last year. Pt reports sometimes it is right when she wakes up, sometimes throughout the day. Today pt has not eaten, dizziness happened when she woke up. Denies pain.

## 2014-09-20 LAB — URINE CULTURE: Colony Count: 4000

## 2014-10-19 ENCOUNTER — Emergency Department (HOSPITAL_COMMUNITY)
Admission: EM | Admit: 2014-10-19 | Discharge: 2014-10-19 | Disposition: A | Discharge status: Home / Self Care | Payer: Medicaid Other | Source: Home / Self Care | Attending: Family Medicine | Admitting: Family Medicine

## 2014-10-19 ENCOUNTER — Other Ambulatory Visit (HOSPITAL_COMMUNITY)
Admission: RE | Admit: 2014-10-19 | Discharge: 2014-10-19 | Disposition: A | Discharge status: Home / Self Care | Payer: Medicaid Other | Source: Ambulatory Visit | Attending: Family Medicine | Admitting: Family Medicine

## 2014-10-19 ENCOUNTER — Encounter (HOSPITAL_COMMUNITY): Payer: Self-pay | Admitting: Emergency Medicine

## 2014-10-19 DIAGNOSIS — N760 Acute vaginitis: Secondary | ICD-10-CM | POA: Insufficient documentation

## 2014-10-19 DIAGNOSIS — Z113 Encounter for screening for infections with a predominantly sexual mode of transmission: Secondary | ICD-10-CM | POA: Insufficient documentation

## 2014-10-19 LAB — POCT URINALYSIS DIP (DEVICE)
Bilirubin Urine: NEGATIVE
Glucose, UA: NEGATIVE mg/dL
KETONES UR: NEGATIVE mg/dL
Nitrite: NEGATIVE
PROTEIN: NEGATIVE mg/dL
SPECIFIC GRAVITY, URINE: 1.01 (ref 1.005–1.030)
Urobilinogen, UA: 0.2 mg/dL (ref 0.0–1.0)
pH: 7.5 (ref 5.0–8.0)

## 2014-10-19 LAB — POCT PREGNANCY, URINE: Preg Test, Ur: NEGATIVE

## 2014-10-19 MED ORDER — METRONIDAZOLE 500 MG PO TABS: 500.0000 mg | ORAL_TABLET | Freq: Two times a day (BID) | ORAL | Status: DC

## 2014-10-19 NOTE — ED Provider Notes (Addendum)
CSN: [id number]     Arrival date & time 10/19/14  1509 History   None    Chief Complaint  Patient presents with  . Vaginal Itching   (Consider location/radiation/quality/duration/timing/severity/associated sxs/prior Treatment) HPI   Vaginal itching/irritation and discharge: started 4 days ago. Intermittent. Getting worse. Worse at night. monostat w/o benefit. No recent ABX. Sexually active w/o condoms.   appt on 10/31/14 for birth control w/ PCP.   Denies abdominal pain, dysuria, frequency, rash, nausea, vomiting, constipation, chest pain, shortness of breath, neck stiffness, headache.  Past Medical History  Diagnosis Date  . Headache(784.0)   . Heart murmur   . Trichimoniasis   . H/O chlamydia infection    Past Surgical History  Procedure Laterality Date  . Root canal    . Wisdom tooth extraction     Family History  Problem Relation Age of Onset  . Asthma Mother   . Hypertension Mother   . Asthma Father   . Heart disease Father   . Hypertension Father   . Hyperlipidemia Father   . Vision loss Father   . Anesthesia problems Neg Hx   . Hypotension Neg Hx   . Malignant hyperthermia Neg Hx   . Pseudochol deficiency Neg Hx   . Other Neg Hx    History  Substance Use Topics  . Smoking status: Current Every Day Smoker -- 0.50 packs/day for 6 years    Types: Cigarettes  . Smokeless tobacco: Never Used  . Alcohol Use: No   OB History    Gravida Para Term Preterm AB TAB SAB Ectopic Multiple Living   2 2 2  0 0 0 0 0 0 2     Review of Systems Per HPI with all other pertinent systems negative.   Allergies  Review of patient's allergies indicates no known allergies.  Home Medications   Prior to Admission medications   Medication Sig Start Date End Date Taking? Authorizing Provider  ibuprofen (ADVIL,MOTRIN) 400 MG tablet Take 400 mg by mouth every 6 (six) hours as needed for moderate pain.   Yes Historical Provider, MD  metroNIDAZOLE (FLAGYL) 500 MG tablet Take 1  tablet (500 mg total) by mouth 2 (two) times daily. 10/19/14   Ozella Rocksavid J Merrell, MD   LMP 09/09/2014 (Approximate) Physical Exam Physical Exam  Constitutional: oriented to person, place, and time. appears well-developed and well-nourished. No distress.  HENT:  Head: Normocephalic and atraumatic.  Eyes: EOMI. PERRL.  Neck: Normal range of motion.  Cardiovascular: RRR, no m/r/g, 2+ distal pulses,  Pulmonary/Chest: Effort normal and breath sounds normal. No respiratory distress.  Abdominal: Soft. Bowel sounds are normal. NonTTP, no distension.  Musculoskeletal: Normal range of motion. Non ttp, no effusion.  Neurological: alert and oriented to person, place, and time.  Skin: Skin is warm. No rash noted. non diaphoretic.  Psychiatric: normal mood and affect. behavior is normal. Judgment and thought content normal.  GU: Vaginal walls well rugated without lesions. Cervix normal-appearing. Copious white discharge. No cervical motion tenderness.  ED Course  Procedures (including critical care time) Labs Review Labs Reviewed  POCT URINALYSIS DIP (DEVICE) - Abnormal; Notable for the following:    Hgb urine dipstick SMALL (*)    Leukocytes, UA LARGE (*)    All other components within normal limits  URINE CULTURE  POCT PREGNANCY, URINE  CERVICOVAGINAL ANCILLARY ONLY    Imaging Review No results found.   MDM   1. Vaginitis    SPECT BV. Start Metro. Wet prep and STD  testing sent. UA with moderate leuks but no overt signs or symptoms of UTI. We'll send urine culture and treat if necessary.  Chest contraceptive management at length with patient she'll follow-up with her PCP regarding further treatments.  Ozella Rocksavid J Merrell, MD 10/19/14 1638  Ozella Rocksavid J Merrell, MD 10/19/14 [phone]

## 2014-10-19 NOTE — ED Notes (Signed)
Pt has had vaginal itching for 4 days.  She denies any other symptoms.

## 2014-10-19 NOTE — Discharge Instructions (Signed)
You likely have a vaginal infection called BV. This is easily treated with metronidazole. Please do not drink while taking this medication. Please take it for the full 7 days. Will call you if any of your other results come back positive.

## 2014-10-20 LAB — URINE CULTURE
CULTURE: NO GROWTH
Colony Count: NO GROWTH

## 2014-10-22 LAB — CERVICOVAGINAL ANCILLARY ONLY
CHLAMYDIA, DNA PROBE: NEGATIVE
Neisseria Gonorrhea: NEGATIVE
WET PREP (BD AFFIRM): POSITIVE — AB

## 2014-11-02 NOTE — ED Notes (Signed)
Final report GC, chlamydia, trichamonis, yeast negative. Positive for gardnerella, treatment adequate w flagyl

## 2014-12-31 ENCOUNTER — Emergency Department (HOSPITAL_COMMUNITY)
Admission: EM | Admit: 2014-12-31 | Discharge: 2014-12-31 | Disposition: A | Discharge status: Home / Self Care | Payer: Self-pay | Source: Home / Self Care

## 2014-12-31 ENCOUNTER — Encounter (HOSPITAL_COMMUNITY): Payer: Self-pay | Admitting: Nurse Practitioner

## 2014-12-31 ENCOUNTER — Emergency Department (HOSPITAL_COMMUNITY)
Admission: EM | Admit: 2014-12-31 | Discharge: 2014-12-31 | Disposition: A | Discharge status: Home / Self Care | Payer: Medicaid Other | Attending: Emergency Medicine | Admitting: Emergency Medicine

## 2014-12-31 ENCOUNTER — Emergency Department (HOSPITAL_COMMUNITY): Payer: Medicaid Other

## 2014-12-31 DIAGNOSIS — R102 Pelvic and perineal pain unspecified side: Secondary | ICD-10-CM

## 2014-12-31 DIAGNOSIS — R011 Cardiac murmur, unspecified: Secondary | ICD-10-CM | POA: Insufficient documentation

## 2014-12-31 DIAGNOSIS — G479 Sleep disorder, unspecified: Secondary | ICD-10-CM | POA: Diagnosis not present

## 2014-12-31 DIAGNOSIS — Z8619 Personal history of other infectious and parasitic diseases: Secondary | ICD-10-CM | POA: Diagnosis not present

## 2014-12-31 DIAGNOSIS — N739 Female pelvic inflammatory disease, unspecified: Secondary | ICD-10-CM | POA: Diagnosis not present

## 2014-12-31 DIAGNOSIS — N898 Other specified noninflammatory disorders of vagina: Secondary | ICD-10-CM

## 2014-12-31 DIAGNOSIS — Z792 Long term (current) use of antibiotics: Secondary | ICD-10-CM | POA: Diagnosis not present

## 2014-12-31 DIAGNOSIS — Z720 Tobacco use: Secondary | ICD-10-CM | POA: Insufficient documentation

## 2014-12-31 LAB — URINALYSIS, ROUTINE W REFLEX MICROSCOPIC
Bilirubin Urine: NEGATIVE
GLUCOSE, UA: NEGATIVE mg/dL
Ketones, ur: 15 mg/dL — AB
Nitrite: NEGATIVE
Protein, ur: 30 mg/dL — AB
Specific Gravity, Urine: 1.022 (ref 1.005–1.030)
Urobilinogen, UA: 1 mg/dL (ref 0.0–1.0)
pH: 6 (ref 5.0–8.0)

## 2014-12-31 LAB — URINE MICROSCOPIC-ADD ON

## 2014-12-31 LAB — WET PREP, GENITAL
Trich, Wet Prep: NONE SEEN
Yeast Wet Prep HPF POC: NONE SEEN

## 2014-12-31 MED ORDER — ONDANSETRON 4 MG PO TBDP
8.0000 mg | ORAL_TABLET | Freq: Once | ORAL | Status: AC
  Administered 2014-12-31: 8 mg via ORAL
  Filled 2014-12-31: qty 2

## 2014-12-31 MED ORDER — STERILE WATER FOR INJECTION IJ SOLN
INTRAMUSCULAR | Status: AC
  Administered 2014-12-31: 0.9 mL
  Filled 2014-12-31: qty 10

## 2014-12-31 MED ORDER — DOXYCYCLINE HYCLATE 100 MG PO CAPS: 100.0000 mg | ORAL_CAPSULE | Freq: Two times a day (BID) | ORAL | Status: DC

## 2014-12-31 MED ORDER — IBUPROFEN 800 MG PO TABS: 800.0000 mg | ORAL_TABLET | Freq: Three times a day (TID) | ORAL | Status: DC | PRN

## 2014-12-31 MED ORDER — CEFTRIAXONE SODIUM 250 MG IJ SOLR
250.0000 mg | Freq: Once | INTRAMUSCULAR | Status: AC
  Administered 2014-12-31: 250 mg via INTRAMUSCULAR
  Filled 2014-12-31: qty 250

## 2014-12-31 MED ORDER — OXYCODONE-ACETAMINOPHEN 5-325 MG PO TABS
1.0000 | ORAL_TABLET | Freq: Once | ORAL | Status: AC
  Administered 2014-12-31: 1 via ORAL
  Filled 2014-12-31: qty 1

## 2014-12-31 MED ORDER — HYDROCODONE-ACETAMINOPHEN 5-325 MG PO TABS: 1.0000 | ORAL_TABLET | Freq: Four times a day (QID) | ORAL | Status: DC | PRN

## 2014-12-31 NOTE — ED Notes (Signed)
She c/o 2 days history of pelvic pain, urinary burning and hesitancy, worse today. She tried motrin with no relief. She denies n/v/d, vaginal discharge, vaginal bleeding

## 2014-12-31 NOTE — ED Provider Notes (Signed)
CSN: [id number]     Arrival date & time 12/31/14  1512 History   First MD Initiated Contact with Patient 12/31/14 1543     Chief Complaint  Patient presents with  . Pelvic Pain     (Consider location/radiation/quality/duration/timing/severity/associated sxs/prior Treatment) HPI   Patient is a 24 year old female with PMHx of heart murmur and STD exposure who presents with lower abdominal pain and tenderness.  Patient states that 2 days ago she awoke from sleep with pelvic pain that has progressively gotten worse.  Patient states she has tried Ibuprofen for her pain with minimal relief.  Patient states that 1 week ago she had white discharge and self-treated with Monistat.  Her symptoms improved until 2 days ago with the onset of pain.  Patient endorses pain with urination, hesitancy, as well as discharge.  She denies fever, chills, chest pain, or shortness of breath.   Past Medical History  Diagnosis Date  . Headache(784.0)   . Heart murmur   . Trichimoniasis   . H/O chlamydia infection    Past Surgical History  Procedure Laterality Date  . Root canal    . Wisdom tooth extraction     Family History  Problem Relation Age of Onset  . Asthma Mother   . Hypertension Mother   . Asthma Father   . Heart disease Father   . Hypertension Father   . Hyperlipidemia Father   . Vision loss Father   . Anesthesia problems Neg Hx   . Hypotension Neg Hx   . Malignant hyperthermia Neg Hx   . Pseudochol deficiency Neg Hx   . Other Neg Hx    History  Substance Use Topics  . Smoking status: Current Every Day Smoker -- 0.50 packs/day for 6 years    Types: Cigarettes  . Smokeless tobacco: Never Used  . Alcohol Use: No   OB History    Gravida Para Term Preterm AB TAB SAB Ectopic Multiple Living   2 2 2  0 0 0 0 0 0 2     Review of Systems    Allergies  Review of patient's allergies indicates no known allergies.  Home Medications   Prior to Admission medications   Medication Sig  Start Date End Date Taking? Authorizing Provider  ibuprofen (ADVIL,MOTRIN) 400 MG tablet Take 400 mg by mouth every 6 (six) hours as needed for moderate pain.    Historical Provider, MD  metroNIDAZOLE (FLAGYL) 500 MG tablet Take 1 tablet (500 mg total) by mouth 2 (two) times daily. 10/19/14   Ozella Rocks, MD   BP 126/82 mmHg  Pulse 93  Temp(Src) 98.5 F (36.9 C) (Oral)  Resp 18  SpO2 100% Physical Exam  Constitutional: She is oriented to person, place, and time. She appears well-developed and well-nourished. No distress.  HENT:  Head: Normocephalic and atraumatic.  Mouth/Throat: Oropharynx is clear and moist.  Eyes: Pupils are equal, round, and reactive to light.  Neck: Normal range of motion. Neck supple.  Cardiovascular: Normal rate, regular rhythm and normal heart sounds.  Exam reveals no gallop and no friction rub.   No murmur heard. Pulmonary/Chest: Effort normal and breath sounds normal. No respiratory distress. She has no wheezes. She has no rales.  Abdominal: Soft. She exhibits no distension. There is tenderness (in lower right and left quadrant). There is no rebound.  Genitourinary: Uterus normal. Cervix exhibits motion tenderness and discharge. Right adnexum displays tenderness. Left adnexum displays tenderness.  Neurological: She is alert and oriented  to person, place, and time. She exhibits normal muscle tone. Coordination normal.  Skin: Skin is warm and dry.  Psychiatric: She has a normal mood and affect. Her behavior is normal.  Nursing note and vitals reviewed.   ED Course  Procedures (including critical care time) Labs Review Labs Reviewed  WET PREP, GENITAL - Abnormal; Notable for the following:    Clue Cells Wet Prep HPF POC MODERATE (*)    WBC, Wet Prep HPF POC FEW (*)    All other components within normal limits  URINALYSIS, ROUTINE W REFLEX MICROSCOPIC (NOT AT [facility]) - Abnormal; Notable for the following:    Color, Urine AMBER (*)    APPearance TURBID (*)     Hgb urine dipstick LARGE (*)    Ketones, ur 15 (*)    Protein, ur 30 (*)    Leukocytes, UA LARGE (*)    All other components within normal limits  URINE MICROSCOPIC-ADD ON - Abnormal; Notable for the following:    Squamous Epithelial / LPF MANY (*)    Bacteria, UA MANY (*)    All other components within normal limits  RPR  POC URINE PREG, ED  GC/CHLAMYDIA PROBE AMP ([place]) NOT AT [facility]    Patient be treated for PID.  Told to return here as needed.  She also advised to increase her fluid intake, rest as much as possible    Charlestine Night, PA-C 01/04/15 0131  Geoffery Lyons, MD 01/05/15 [phone]

## 2014-12-31 NOTE — Discharge Instructions (Signed)
Return here as needed.  Follow up with a primary care doctor °

## 2015-01-01 LAB — RPR: RPR Ser Ql: NONREACTIVE

## 2015-01-01 LAB — GC/CHLAMYDIA PROBE AMP (~~LOC~~) NOT AT ARMC
Chlamydia: NEGATIVE
Neisseria Gonorrhea: NEGATIVE

## 2015-05-06 ENCOUNTER — Encounter (HOSPITAL_COMMUNITY): Payer: Self-pay | Admitting: *Deleted

## 2015-05-06 ENCOUNTER — Emergency Department (HOSPITAL_COMMUNITY)
Admission: EM | Admit: 2015-05-06 | Discharge: 2015-05-07 | Disposition: A | Discharge status: Home / Self Care | Payer: Medicaid Other | Attending: Emergency Medicine | Admitting: Emergency Medicine

## 2015-05-06 ENCOUNTER — Emergency Department (HOSPITAL_COMMUNITY): Payer: Medicaid Other

## 2015-05-06 DIAGNOSIS — N760 Acute vaginitis: Secondary | ICD-10-CM | POA: Insufficient documentation

## 2015-05-06 DIAGNOSIS — J069 Acute upper respiratory infection, unspecified: Secondary | ICD-10-CM | POA: Insufficient documentation

## 2015-05-06 DIAGNOSIS — F17210 Nicotine dependence, cigarettes, uncomplicated: Secondary | ICD-10-CM | POA: Insufficient documentation

## 2015-05-06 DIAGNOSIS — R011 Cardiac murmur, unspecified: Secondary | ICD-10-CM | POA: Insufficient documentation

## 2015-05-06 DIAGNOSIS — Z3202 Encounter for pregnancy test, result negative: Secondary | ICD-10-CM | POA: Diagnosis not present

## 2015-05-06 DIAGNOSIS — B9689 Other specified bacterial agents as the cause of diseases classified elsewhere: Secondary | ICD-10-CM

## 2015-05-06 DIAGNOSIS — Z8619 Personal history of other infectious and parasitic diseases: Secondary | ICD-10-CM | POA: Insufficient documentation

## 2015-05-06 DIAGNOSIS — R05 Cough: Secondary | ICD-10-CM | POA: Diagnosis present

## 2015-05-06 LAB — POC URINE PREG, ED: PREG TEST UR: NEGATIVE

## 2015-05-06 LAB — WET PREP, GENITAL
Sperm: NONE SEEN
Trich, Wet Prep: NONE SEEN
Yeast Wet Prep HPF POC: NONE SEEN

## 2015-05-06 MED ORDER — METRONIDAZOLE 500 MG PO TABS
500.0000 mg | ORAL_TABLET | Freq: Once | ORAL | Status: AC
  Administered 2015-05-07: 500 mg via ORAL
  Filled 2015-05-06: qty 1

## 2015-05-06 MED ORDER — TRAMADOL HCL 50 MG PO TABS
50.0000 mg | ORAL_TABLET | Freq: Once | ORAL | Status: AC
  Administered 2015-05-06: 50 mg via ORAL
  Filled 2015-05-06: qty 1

## 2015-05-06 MED ORDER — ONDANSETRON 4 MG PO TBDP
4.0000 mg | ORAL_TABLET | Freq: Once | ORAL | Status: AC
  Administered 2015-05-06: 4 mg via ORAL
  Filled 2015-05-06: qty 1

## 2015-05-06 MED ORDER — HYDROCODONE-HOMATROPINE 5-1.5 MG/5ML PO SYRP
5.0000 mL | ORAL_SOLUTION | Freq: Once | ORAL | Status: AC
  Administered 2015-05-06: 5 mL via ORAL
  Filled 2015-05-06: qty 5

## 2015-05-06 NOTE — ED Notes (Signed)
Pt states results should not be discussed in front of visitors

## 2015-05-06 NOTE — ED Notes (Signed)
PA at bedside.

## 2015-05-06 NOTE — ED Provider Notes (Signed)
CSN: [id number]     Arrival date & time 05/06/15  2155 History   First MD Initiated Contact with Patient 05/06/15 2220     Chief Complaint  Patient presents with  . Urinary Tract Infection  . URI   (Consider location/radiation/quality/duration/timing/severity/associated sxs/prior Treatment) The history is provided by the patient and a friend. No language interpreter was used.     Kim Harvey is a 24 year old female with a history of Trichomonas and Chlamydia who presents for nonproductive cough, congestion, and shortness of breath 10 days. She also reports that she feels like she is getting a urinary tract infection with urinary frequency and urgency and pelvic discomfort. She does not know if she is pregnant or not. Her last menstrual period was 04/26/2015. She denies being on birth control. States she has had 1 sexual partner in the last 6 months. She is also complaining of white vaginal discharge but no vaginal itching or burning. She denies any vaginal bleeding. She denies any fever, chills, chest pain, nausea, vomiting, back pain, hematuria. She states she would like to be checked for all STDs. She smokes 1ppd.  Past Medical History  Diagnosis Date  . Headache(784.0)   . Heart murmur   . Trichimoniasis   . H/O chlamydia infection    Past Surgical History  Procedure Laterality Date  . Root canal    . Wisdom tooth extraction     Family History  Problem Relation Age of Onset  . Asthma Mother   . Hypertension Mother   . Asthma Father   . Heart disease Father   . Hypertension Father   . Hyperlipidemia Father   . Vision loss Father   . Anesthesia problems Neg Hx   . Hypotension Neg Hx   . Malignant hyperthermia Neg Hx   . Pseudochol deficiency Neg Hx   . Other Neg Hx    Social History  Substance Use Topics  . Smoking status: Current Every Day Smoker -- 0.50 packs/day for 6 years    Types: Cigarettes  . Smokeless tobacco: Never Used  . Alcohol Use: No   OB History    Gravida Para Term Preterm AB TAB SAB Ectopic Multiple Living   2 2 2  0 0 0 0 0 0 2     Review of Systems  Constitutional: Negative for fever.  Gastrointestinal: Negative for nausea and vomiting.  Genitourinary: Positive for frequency, vaginal discharge and pelvic pain. Negative for hematuria, flank pain, vaginal bleeding and menstrual problem.  All other systems reviewed and are negative.     Allergies  Review of patient's allergies indicates no known allergies.  Home Medications   Prior to Admission medications   Medication Sig Start Date End Date Taking? Authorizing Provider  acetaminophen (TYLENOL) 500 MG tablet Take 1,000 mg by mouth every 6 (six) hours as needed (for pain.).   Yes Historical Provider, MD  aspirin-acetaminophen-caffeine (EXCEDRIN MIGRAINE) 772 500 8426250-250-65 MG tablet Take 2 tablets by mouth every 6 (six) hours as needed for headache.   Yes Historical Provider, MD  ibuprofen (ADVIL,MOTRIN) 800 MG tablet Take 1 tablet (800 mg total) by mouth every 8 (eight) hours as needed. 12/31/14  Yes Christopher Lawyer, PA-C  Pseudoeph-Doxylamine-DM-APAP (DAYQUIL/NYQUIL COLD/FLU RELIEF PO) Take 2 capsules by mouth every 6 (six) hours as needed (for cold symptoms).   Yes Historical Provider, MD  doxycycline (VIBRAMYCIN) 100 MG capsule Take 1 capsule (100 mg total) by mouth 2 (two) times daily. Patient not taking: Reported on 05/06/2015 12/31/14   Cristal Deerhristopher  Lawyer, PA-C  HYDROcodone-acetaminophen (NORCO/VICODIN) 5-325 MG per tablet Take 1 tablet by mouth every 6 (six) hours as needed for moderate pain. Patient not taking: Reported on 05/06/2015 12/31/14   Charlestine Night, PA-C  metroNIDAZOLE (FLAGYL) 500 MG tablet Take 1 tablet (500 mg total) by mouth 2 (two) times daily. 05/07/15   Skylarr Liz Patel-Mills, PA-C   BP 104/60 mmHg  Pulse 57  Temp(Src) 98.3 F (36.8 C) (Oral)  Resp 18  SpO2 98%  LMP 04/26/2015 Physical Exam  Constitutional: She is oriented to person, place, and time. She  appears well-developed and well-nourished. No distress.  HENT:  Head: Normocephalic and atraumatic.  Eyes: Conjunctivae are normal.  Neck: Normal range of motion. Neck supple.  Cardiovascular: Normal rate, regular rhythm and normal heart sounds.   Pulmonary/Chest: Effort normal and breath sounds normal.  No respiratory distress or shortness of breath. Lungs are clear to auscultation bilaterally. No wheezing or decreased breath sounds.  Heart: Regular rate and rhythm. No murmurs.  Abdominal: Soft. Normal appearance. She exhibits no distension. There is tenderness in the suprapubic area. There is no rebound, no guarding and no CVA tenderness.    Mild suprapubic tenderness to palpation. No guarding or rebound. No abdominal distention. No CVA tenderness.  Genitourinary: Pelvic exam was performed with patient supine.  Pelvic exam: Chaperone present. Cervical os is closed. No vaginal bleeding. Small amount of nonodorous white vaginal discharge. No adnexal tenderness.   Musculoskeletal: Normal range of motion.  Neurological: She is alert and oriented to person, place, and time.  Skin: Skin is warm and dry.  Psychiatric: She has a normal mood and affect.  Nursing note and vitals reviewed.   ED Course  Procedures (including critical care time) Labs Review Labs Reviewed  WET PREP, GENITAL - Abnormal; Notable for the following:    Clue Cells Wet Prep HPF POC PRESENT (*)    WBC, Wet Prep HPF POC MANY (*)    All other components within normal limits  URINALYSIS, ROUTINE W REFLEX MICROSCOPIC (NOT AT [facility]) - Abnormal; Notable for the following:    APPearance CLOUDY (*)    Hgb urine dipstick MODERATE (*)    All other components within normal limits  URINE MICROSCOPIC-ADD ON - Abnormal; Notable for the following:    Squamous Epithelial / LPF 0-5 (*)    Bacteria, UA RARE (*)    All other components within normal limits  RPR  HIV ANTIBODY (ROUTINE TESTING)  POC URINE PREG, ED  GC/CHLAMYDIA  PROBE AMP ([place]) NOT AT [facility]    Imaging Review Dg Chest 2 View  05/06/2015  CLINICAL DATA:  Acute onset of cough, nausea and generalized weakness. Near syncope. Initial encounter. EXAM: CHEST  2 VIEW COMPARISON:  Chest radiograph performed 05/10/2014 FINDINGS: The lungs are well-aerated and clear. There is no evidence of focal opacification, pleural effusion or pneumothorax. The heart is normal in size; the mediastinal contour is within normal limits. No acute osseous abnormalities are seen. IMPRESSION: No acute cardiopulmonary process seen. Electronically Signed   By: Roanna Raider M.D.   On: 05/06/2015 23:33   I have personally reviewed and evaluated these images and lab results as part of my medical decision-making.   EKG Interpretation None      MDM   Final diagnoses:  Bacterial vaginosis  Upper respiratory infection, viral   Patient presents for upper respiratory complaints and vaginal discharge. On wet prep she has clue cells and many wbc's. No trich.  The rest of her  STD panel will not result for a few days.  I discussed this with the patient. I will treat her with Flagyl for BV. Recheck:  Patient is nauseated.  Will order zofran. She is not pregnant.  Chest x-ray was negative for infiltrate, edema, or pneumothorax. I believe this is a viral upper respiratory infection. She was treated while in the ED with Hycodan and tramadol for pain. I discussed return precautions with the patient as well as follow-up at [facility] outpatient clinic. She verbally agrees with the plan.  Medications  HYDROcodone-homatropine (HYCODAN) 5-1.5 MG/5ML syrup 5 mL (5 mLs Oral Given 05/06/15 2244)  traMADol (ULTRAM) tablet 50 mg (50 mg Oral Given 05/06/15 2327)  ondansetron (ZOFRAN-ODT) disintegrating tablet 4 mg (4 mg Oral Given 05/06/15 2326)  metroNIDAZOLE (FLAGYL) tablet 500 mg (500 mg Oral Given 05/07/15 0023)   Filed Vitals:   05/06/15 2216 05/07/15 0021  BP: 135/93 104/60  Pulse: 78 57    Temp: 98.3 F (36.8 C)   Resp: 18 [phone], PA-C 05/07/15 1645  Lyndal Pulley, MD 05/08/15 1515

## 2015-05-06 NOTE — ED Notes (Signed)
Pt to Xray.

## 2015-05-06 NOTE — ED Notes (Signed)
Pt c/o nausea and dry heaving. Patel-Mills, PA notified.

## 2015-05-06 NOTE — ED Notes (Signed)
Pt. Is unable to urinate at this time.  

## 2015-05-06 NOTE — ED Notes (Signed)
Pt returned from X Ray.

## 2015-05-06 NOTE — ED Notes (Signed)
Pt states that she has had cough and congestion for the last 10 days; pt states that when she coughs she has burning to her chest; pt reports NP cough; pt also c/o frequent UTI's and states that she feels like she is getting another one; pt states that she has been having urgency and lower abd cramping and lower back discomfort; pt states that she has frequent UTI's

## 2015-05-07 LAB — GC/CHLAMYDIA PROBE AMP (~~LOC~~) NOT AT ARMC
Chlamydia: NEGATIVE
Neisseria Gonorrhea: NEGATIVE

## 2015-05-07 LAB — URINE MICROSCOPIC-ADD ON

## 2015-05-07 LAB — URINALYSIS, ROUTINE W REFLEX MICROSCOPIC
Bilirubin Urine: NEGATIVE
Glucose, UA: NEGATIVE mg/dL
Ketones, ur: NEGATIVE mg/dL
Leukocytes, UA: NEGATIVE
Nitrite: NEGATIVE
Protein, ur: NEGATIVE mg/dL
Specific Gravity, Urine: 1.019 (ref 1.005–1.030)
pH: 7.5 (ref 5.0–8.0)

## 2015-05-07 LAB — SYPHILIS: RPR W/REFLEX TO RPR TITER AND TREPONEMAL ANTIBODIES, TRADITIONAL SCREENING AND DIAGNOSIS ALGORITHM: RPR Ser Ql: NONREACTIVE

## 2015-05-07 LAB — HIV ANTIBODY (ROUTINE TESTING W REFLEX): HIV SCREEN 4TH GENERATION: NONREACTIVE

## 2015-05-07 MED ORDER — METRONIDAZOLE 500 MG PO TABS: 500.0000 mg | ORAL_TABLET | Freq: Two times a day (BID) | ORAL | Status: DC

## 2015-05-07 NOTE — Discharge Instructions (Signed)
Bacterial Vaginosis Follow-up with women's outpatient clinic. Take Flagyl as prescribed. Do not drink alcohol using this medication. The hospital will call you if STD results are positive. Bacterial vaginosis is a vaginal infection that occurs when the normal balance of bacteria in the vagina is disrupted. It results from an overgrowth of certain bacteria. This is the most common vaginal infection in women of childbearing age. Treatment is important to prevent complications, especially in pregnant women, as it can cause a premature delivery. CAUSES  Bacterial vaginosis is caused by an increase in harmful bacteria that are normally present in smaller amounts in the vagina. Several different kinds of bacteria can cause bacterial vaginosis. However, the reason that the condition develops is not fully understood. RISK FACTORS Certain activities or behaviors can put you at an increased risk of developing bacterial vaginosis, including:  Having a new sex partner or multiple sex partners.  Douching.  Using an intrauterine device (IUD) for contraception. Women do not get bacterial vaginosis from toilet seats, bedding, swimming pools, or contact with objects around them. SIGNS AND SYMPTOMS  Some women with bacterial vaginosis have no signs or symptoms. Common symptoms include:  Grey vaginal discharge.  A fishlike odor with discharge, especially after sexual intercourse.  Itching or burning of the vagina and vulva.  Burning or pain with urination. DIAGNOSIS  Your health care provider will take a medical history and examine the vagina for signs of bacterial vaginosis. A sample of vaginal fluid may be taken. Your health care provider will look at this sample under a microscope to check for bacteria and abnormal cells. A vaginal pH test may also be done.  TREATMENT  Bacterial vaginosis may be treated with antibiotic medicines. These may be given in the form of a pill or a vaginal cream. A second round  of antibiotics may be prescribed if the condition comes back after treatment. Because bacterial vaginosis increases your risk for sexually transmitted diseases, getting treated can help reduce your risk for chlamydia, gonorrhea, HIV, and herpes. HOME CARE INSTRUCTIONS   Only take over-the-counter or prescription medicines as directed by your health care provider.  If antibiotic medicine was prescribed, take it as directed. Make sure you finish it even if you start to feel better.  Tell all sexual partners that you have a vaginal infection. They should see their health care provider and be treated if they have problems, such as a mild rash or itching.  During treatment, it is important that you follow these instructions:  Avoid sexual activity or use condoms correctly.  Do not douche.  Avoid alcohol as directed by your health care provider.  Avoid breastfeeding as directed by your health care provider. SEEK MEDICAL CARE IF:   Your symptoms are not improving after 3 days of treatment.  You have increased discharge or pain.  You have a fever. MAKE SURE YOU:   Understand these instructions.  Will watch your condition.  Will get help right away if you are not doing well or get worse. FOR MORE INFORMATION  Centers for Disease Control and Prevention, Division of STD Prevention: SolutionApps.co.zawww.cdc.gov/std American Sexual Health Association (ASHA): www.ashastd.org    This information is not intended to replace advice given to you by your health care provider. Make sure you discuss any questions you have with your health care provider.   Document Released: 05/18/2005 Document Revised: 06/08/2014 Document Reviewed: 12/28/2012 Elsevier Interactive Patient Education [employer]2016 Elsevier Inc.

## 2015-06-06 ENCOUNTER — Encounter: Payer: Medicaid Other | Admitting: Obstetrics & Gynecology

## 2015-07-17 ENCOUNTER — Inpatient Hospital Stay (HOSPITAL_COMMUNITY)
Admission: AD | Admit: 2015-07-17 | Discharge: 2015-07-17 | Disposition: A | Discharge status: Home / Self Care | Payer: Medicaid Other | Source: Ambulatory Visit | Attending: Obstetrics & Gynecology | Admitting: Obstetrics & Gynecology

## 2015-07-17 DIAGNOSIS — R51 Headache: Secondary | ICD-10-CM | POA: Diagnosis not present

## 2015-07-17 DIAGNOSIS — Z7982 Long term (current) use of aspirin: Secondary | ICD-10-CM | POA: Insufficient documentation

## 2015-07-17 DIAGNOSIS — N898 Other specified noninflammatory disorders of vagina: Secondary | ICD-10-CM | POA: Insufficient documentation

## 2015-07-17 DIAGNOSIS — Z3202 Encounter for pregnancy test, result negative: Secondary | ICD-10-CM | POA: Diagnosis not present

## 2015-07-17 DIAGNOSIS — R1084 Generalized abdominal pain: Secondary | ICD-10-CM | POA: Diagnosis not present

## 2015-07-17 DIAGNOSIS — R011 Cardiac murmur, unspecified: Secondary | ICD-10-CM | POA: Diagnosis not present

## 2015-07-17 DIAGNOSIS — F17210 Nicotine dependence, cigarettes, uncomplicated: Secondary | ICD-10-CM | POA: Insufficient documentation

## 2015-07-17 LAB — WET PREP, GENITAL
CLUE CELLS WET PREP: NONE SEEN
Sperm: NONE SEEN
Trich, Wet Prep: NONE SEEN
Yeast Wet Prep HPF POC: NONE SEEN

## 2015-07-17 LAB — URINALYSIS, ROUTINE W REFLEX MICROSCOPIC
BILIRUBIN URINE: NEGATIVE
GLUCOSE, UA: NEGATIVE mg/dL
KETONES UR: NEGATIVE mg/dL
Leukocytes, UA: NEGATIVE
Nitrite: NEGATIVE
PROTEIN: NEGATIVE mg/dL
Specific Gravity, Urine: <1.005 — ABNORMAL LOW (ref 1.005–1.030)
pH: 7 (ref 5.0–8.0)

## 2015-07-17 LAB — CBC WITH DIFFERENTIAL/PLATELET
BASOS ABS: 0 10*3/uL (ref 0.0–0.1)
BASOS PCT: 1 %
EOS ABS: 0.1 10*3/uL (ref 0.0–0.7)
Eosinophils Relative: 2 %
HCT: 36.8 % (ref 36.0–46.0)
HEMOGLOBIN: 12.6 g/dL (ref 12.0–15.0)
Lymphocytes Relative: 35 %
Lymphs Abs: 2 10*3/uL (ref 0.7–4.0)
MCH: 32.1 pg (ref 26.0–34.0)
MCHC: 34.2 g/dL (ref 30.0–36.0)
MCV: 93.6 fL (ref 78.0–100.0)
Monocytes Absolute: 0.3 10*3/uL (ref 0.1–1.0)
Monocytes Relative: 5 %
NEUTROS PCT: 57 %
Neutro Abs: 3.4 10*3/uL (ref 1.7–7.7)
Platelets: 173 10*3/uL (ref 150–400)
RBC: 3.93 MIL/uL (ref 3.87–5.11)
RDW: 12.7 % (ref 11.5–15.5)
WBC: 5.8 10*3/uL (ref 4.0–10.5)

## 2015-07-17 LAB — URINE MICROSCOPIC-ADD ON

## 2015-07-17 LAB — POCT PREGNANCY, URINE: PREG TEST UR: NEGATIVE

## 2015-07-17 NOTE — MAU Note (Signed)
Cramping in lower stomach for 2 wks.  Initially was after intercourse, now it just comes and goes

## 2015-07-17 NOTE — Discharge Instructions (Signed)

## 2015-07-17 NOTE — MAU Provider Note (Signed)
History     CSN: [id number]  Arrival date and time: 07/17/15 1120   First Provider Initiated Contact with Patient 07/17/15 1220      Chief Complaint  Patient presents with  . Abdominal Pain   HPI Kim Harvey 25 y.o. [phone] nonpregnant female presents with lower abdominal pain x 2 weeks.  It initially started when she was having sex.  She has not had further sex since then.  She has decreased appetite.  Eating make her stomach hurt worse.  She has BM daily and it is normal.  No dysuria, hematuria, vomiting, fever.  SHe has white vaginal discharge.  No contraception used.   OB History    Gravida Para Term Preterm AB TAB SAB Ectopic Multiple Living   2 2 2  0 0 0 0 0 0 2      Past Medical History  Diagnosis Date  . Headache(784.0)   . Heart murmur   . Trichimoniasis   . H/O chlamydia infection     Past Surgical History  Procedure Laterality Date  . Root canal    . Wisdom tooth extraction      Family History  Problem Relation Age of Onset  . Asthma Mother   . Hypertension Mother   . Asthma Father   . Heart disease Father   . Hypertension Father   . Hyperlipidemia Father   . Vision loss Father   . Anesthesia problems Neg Hx   . Hypotension Neg Hx   . Malignant hyperthermia Neg Hx   . Pseudochol deficiency Neg Hx   . Other Neg Hx     Social History  Substance Use Topics  . Smoking status: Current Every Day Smoker -- 0.50 packs/day for 6 years    Types: Cigarettes  . Smokeless tobacco: Never Used  . Alcohol Use: No    Allergies: No Known Allergies  Prescriptions prior to admission  Medication Sig Dispense Refill Last Dose  . aspirin-acetaminophen-caffeine (EXCEDRIN MIGRAINE) 250-250-65 MG tablet Take 2 tablets by mouth every 6 (six) hours as needed for headache.   07/16/2015 at Unknown time  . acetaminophen (TYLENOL) 500 MG tablet Take 1,000 mg by mouth every 6 (six) hours as needed (for pain.).   07/14/2015  . metroNIDAZOLE (FLAGYL) 500 MG tablet Take 1  tablet (500 mg total) by mouth 2 (two) times daily. (Patient not taking: Reported on 07/17/2015) 14 tablet 0 Completed Course at Unknown time  . [DISCONTINUED] doxycycline (VIBRAMYCIN) 100 MG capsule Take 1 capsule (100 mg total) by mouth 2 (two) times daily. (Patient not taking: Reported on 05/06/2015) 28 capsule 0 Completed Course at Unknown time  . [DISCONTINUED] HYDROcodone-acetaminophen (NORCO/VICODIN) 5-325 MG per tablet Take 1 tablet by mouth every 6 (six) hours as needed for moderate pain. (Patient not taking: Reported on 05/06/2015) 15 tablet 0   . [DISCONTINUED] ibuprofen (ADVIL,MOTRIN) 800 MG tablet Take 1 tablet (800 mg total) by mouth every 8 (eight) hours as needed. (Patient not taking: Reported on 07/17/2015) 21 tablet 0 05/06/2015 at Unknown time    ROS Pertinent ROS in HPI.  All other systems are negative.   Physical Exam   Blood pressure 123/75, pulse 77, temperature 98.4 F (36.9 C), temperature source Oral, resp. rate 16, height 5' 4.5" (1.638 m), weight 117 lb 12.8 oz (53.434 kg), last menstrual period 06/27/2015.  Physical Exam  Constitutional: She is oriented to person, place, and time. She appears well-developed and well-nourished. No distress.  HENT:  Head: Normocephalic and atraumatic.  Eyes: Conjunctivae and EOM are normal.  Neck: Normal range of motion. Neck supple.  Cardiovascular: Normal rate.   Respiratory: Effort normal. No respiratory distress.  GI: Soft. She exhibits no distension.  Genitourinary:  Small amt of homogenous discharge. General mild tenderness throughout  Musculoskeletal: Normal range of motion.  Neurological: She is alert and oriented to person, place, and time.  Skin: Skin is warm and dry.  Psychiatric: She has a normal mood and affect. Her behavior is normal.    MAU Course  Procedures  MDM Results for orders placed or performed during the hospital encounter of 07/17/15 (from the past 48 hour(s))  Urinalysis, Routine w reflex microscopic  (not at [facility])     Status: Abnormal   Collection Time: 07/17/15 11:36 AM  Result Value Ref Range   Color, Urine YELLOW YELLOW   APPearance CLEAR CLEAR   Specific Gravity, Urine <1.005 (L) 1.005 - 1.030   pH 7.0 5.0 - 8.0   Glucose, UA NEGATIVE NEGATIVE mg/dL   Hgb urine dipstick MODERATE (A) NEGATIVE   Bilirubin Urine NEGATIVE NEGATIVE   Ketones, ur NEGATIVE NEGATIVE mg/dL   Protein, ur NEGATIVE NEGATIVE mg/dL   Nitrite NEGATIVE NEGATIVE   Leukocytes, UA NEGATIVE NEGATIVE  Urine microscopic-add on     Status: Abnormal   Collection Time: 07/17/15 11:36 AM  Result Value Ref Range   Squamous Epithelial / LPF 0-5 (A) NONE SEEN   WBC, UA 0-5 0 - 5 WBC/hpf   RBC / HPF 0-5 0 - 5 RBC/hpf   Bacteria, UA RARE (A) NONE SEEN  Pregnancy, urine POC     Status: None   Collection Time: 07/17/15 11:41 AM  Result Value Ref Range   Preg Test, Ur NEGATIVE NEGATIVE    Comment:        THE SENSITIVITY OF THIS METHODOLOGY IS >24 mIU/mL   Wet prep, genital     Status: Abnormal   Collection Time: 07/17/15 12:29 PM  Result Value Ref Range   Yeast Wet Prep HPF POC NONE SEEN NONE SEEN   Trich, Wet Prep NONE SEEN NONE SEEN   Clue Cells Wet Prep HPF POC NONE SEEN NONE SEEN   WBC, Wet Prep HPF POC MANY (A) NONE SEEN    Comment: MANY BACTERIA SEEN   Sperm NONE SEEN   CBC with Differential/Platelet     Status: None   Collection Time: 07/17/15 12:44 PM  Result Value Ref Range   WBC 5.8 4.0 - 10.5 K/uL   RBC 3.93 3.87 - 5.11 MIL/uL   Hemoglobin 12.6 12.0 - 15.0 g/dL   HCT 09.8 11.9 - 14.7 %   MCV 93.6 78.0 - 100.0 fL   MCH 32.1 26.0 - 34.0 pg   MCHC 34.2 30.0 - 36.0 g/dL   RDW 82.9 56.2 - 13.0 %   Platelets 173 150 - 400 K/uL   Neutrophils Relative % 57 %   Neutro Abs 3.4 1.7 - 7.7 K/uL   Lymphocytes Relative 35 %   Lymphs Abs 2.0 0.7 - 4.0 K/uL   Monocytes Relative 5 %   Monocytes Absolute 0.3 0.1 - 1.0 K/uL   Eosinophils Relative 2 %   Eosinophils Absolute 0.1 0.0 - 0.7 K/uL   Basophils  Relative 1 %   Basophils Absolute 0.0 0.0 - 0.1 K/uL   No evidence of UTI or clear evidence for vaginal infection CBC negative Vitals stable GC/Chlam pending  Assessment and Plan  A: 1. Generalized abdominal pain    P:  Discharge to home F/u in clinical setting for further eval Patient may return to MAU as needed or if her condition were to change or worsen   Bertram Denver 07/17/2015, 12:21 PM

## 2015-07-18 LAB — RPR: RPR Ser Ql: NONREACTIVE

## 2015-07-18 LAB — HIV ANTIBODY (ROUTINE TESTING W REFLEX): HIV SCREEN 4TH GENERATION: NONREACTIVE

## 2015-07-18 LAB — GC/CHLAMYDIA PROBE AMP (~~LOC~~) NOT AT ARMC
CHLAMYDIA, DNA PROBE: NEGATIVE
NEISSERIA GONORRHEA: NEGATIVE

## 2015-08-14 ENCOUNTER — Ambulatory Visit (INDEPENDENT_AMBULATORY_CARE_PROVIDER_SITE_OTHER): Payer: Medicaid Other | Admitting: Obstetrics & Gynecology

## 2015-08-14 ENCOUNTER — Encounter: Payer: Self-pay | Admitting: Obstetrics & Gynecology

## 2015-08-14 VITALS — BP 121/68 | HR 78 | Temp 98.4°F | Ht 64.0 in | Wt 120.9 lb

## 2015-08-14 DIAGNOSIS — R102 Pelvic and perineal pain: Secondary | ICD-10-CM

## 2015-08-14 DIAGNOSIS — N939 Abnormal uterine and vaginal bleeding, unspecified: Secondary | ICD-10-CM | POA: Diagnosis not present

## 2015-08-14 MED ORDER — NORGESTIMATE-ETH ESTRADIOL 0.25-35 MG-MCG PO TABS: 1.0000 | ORAL_TABLET | Freq: Every day | ORAL | Status: DC

## 2015-08-14 NOTE — Patient Instructions (Signed)
Oral Contraception Information Oral contraceptive pills (OCPs) are medicines taken to prevent pregnancy. OCPs work by preventing the ovaries from releasing eggs. The hormones in OCPs also cause the cervical mucus to thicken, preventing the sperm from entering the uterus. The hormones also cause the uterine lining to become thin, not allowing a fertilized egg to attach to the inside of the uterus. OCPs are highly effective when taken exactly as prescribed. However, OCPs do not prevent sexually transmitted diseases (STDs). Safe sex practices, such as using condoms along with the pill, can help prevent STDs.  Before taking the pill, you may have a physical exam and Pap test. Your health care provider may order blood tests. The health care provider will make sure you are a good candidate for oral contraception. Discuss with your health care provider the possible side effects of the OCP you may be prescribed. When starting an OCP, it can take 2 to 3 months for the body to adjust to the changes in hormone levels in your body.  TYPES OF ORAL CONTRACEPTION  The combination pill--This pill contains estrogen and progestin (synthetic progesterone) hormones. The combination pill comes in 21-day, 28-day, or 91-day packs. Some types of combination pills are meant to be taken continuously (365-day pills). With 21-day packs, you do not take pills for 7 days after the last pill. With 28-day packs, the pill is taken every day. The last 7 pills are without hormones. Certain types of pills have more than 21 hormone-containing pills. With 91-day packs, the first 84 pills contain both hormones, and the last 7 pills contain no hormones or contain estrogen only.  The minipill--This pill contains the progesterone hormone only. The pill is taken every day continuously. It is very important to take the pill at the same time each day. The minipill comes in packs of 28 pills. All 28 pills contain the hormone.  ADVANTAGES OF ORAL  CONTRACEPTIVE PILLS  Decreases premenstrual symptoms.   Treats menstrual period cramps.   Regulates the menstrual cycle.   Decreases a heavy menstrual flow.   May treatacne, depending on the type of pill.   Treats abnormal uterine bleeding.   Treats polycystic ovarian syndrome.   Treats endometriosis.   Can be used as emergency contraception.  THINGS THAT CAN MAKE ORAL CONTRACEPTIVE PILLS LESS EFFECTIVE OCPs can be less effective if:   You forget to take the pill at the same time every day.   You have a stomach or intestinal disease that lessens the absorption of the pill.   You take OCPs with other medicines that make OCPs less effective, such as antibiotics, certain HIV medicines, and some seizure medicines.   You take expired OCPs.   You forget to restart the pill on day 7, when using the packs of 21 pills.  RISKS ASSOCIATED WITH ORAL CONTRACEPTIVE PILLS  Oral contraceptive pills can sometimes cause side effects, such as:  Headache.  Nausea.  Breast tenderness.  Irregular bleeding or spotting. Combination pills are also associated with a small increased risk of:  Blood clots.  Heart attack.  Stroke.   This information is not intended to replace advice given to you by your health care provider. Make sure you discuss any questions you have with your health care provider.   Document Released: 08/08/2002 Document Revised: 03/08/2013 Document Reviewed: 11/06/2012 Elsevier Interactive Patient Education 2016 Elsevier Inc. Dysfunctional Uterine Bleeding Dysfunctional uterine bleeding is abnormal bleeding from the uterus. Dysfunctional uterine bleeding includes:  A period that comes earlier or  later than usual.  A period that is lighter, heavier, or has blood clots.  Bleeding between periods.  Skipping one or more periods.  Bleeding after sexual intercourse.  Bleeding after menopause. HOME CARE INSTRUCTIONS  Pay attention to any changes in  your symptoms. Follow these instructions to help with your condition: Eating  Eat well-balanced meals. Include foods that are high in iron, such as liver, meat, shellfish, green leafy vegetables, and eggs.  If you become constipated:  Drink plenty of water.  Eat fruits and vegetables that are high in water and fiber, such as spinach, carrots, raspberries, apples, and mango. Medicines  Take over-the-counter and prescription medicines only as told by your health care provider.  Do not change medicines without talking with your health care provider.  Aspirin or medicines that contain aspirin may make the bleeding worse. Do not take those medicines:  During the week before your period.  During your period.  If you were prescribed iron pills, take them as told by your health care provider. Iron pills help to replace iron that your body loses because of this condition. Activity  If you need to change your sanitary pad or tampon more than one time every 2 hours:  Lie in bed with your feet raised (elevated).  Place a cold pack on your lower abdomen.  Rest as much as possible until the bleeding stops or slows down.  Do not try to lose weight until the bleeding has stopped and your blood iron level is back to normal. Other Instructions  For two months, write down:  When your period starts.  When your period ends.  When any abnormal bleeding occurs.  What problems you notice.  Keep all follow up visits as told by your health care provider. This is important. SEEK MEDICAL CARE IF:  You get light-headed or weak.  You have nausea and vomiting.  You cannot eat or drink without vomiting.  You feel dizzy or have diarrhea while you are taking medicines.  You are taking birth control pills or hormones, and you want to change them or stop taking them. SEEK IMMEDIATE MEDICAL CARE IF:  You develop a fever or chills.  You need to change your sanitary pad or tampon more than one  time per hour.  Your bleeding becomes heavier, or your flow contains clots more often.  You develop pain in your abdomen.  You lose consciousness.  You develop a rash.   This information is not intended to replace advice given to you by your health care provider. Make sure you discuss any questions you have with your health care provider.   Document Released: 05/15/2000 Document Revised: 02/06/2015 Document Reviewed: 08/13/2014 Elsevier Interactive Patient Education [employer]2016 Elsevier Inc.

## 2015-08-14 NOTE — Progress Notes (Signed)
Scheduled US for March 28th @ 1500. Pt notified.

## 2015-08-14 NOTE — Progress Notes (Signed)
Patient ID: Kim SpanishMegan Denise Harvey, female   DOB: 01-Apr-1991, 25 y.o.   MRN: [id number] History:  25 y.o. [id number] here today for eval of heavy and irreg uterine bleeding.  LMP 08/05/2015 The bleeding began 3 months prev. Pt was prev dx'd with GC, chlamydia and trich.  The last STI was >3 years prev. The pt also c/o pain that has lasted for 1 month.  Pain is better now but, is a constant ache.  Pt not on meds for the pain. Pt did not have sono in the ED.  Pt had neg cx and STI screen in the ED 07/17/2015.  Pt denies irreg or abnormal discharge.  She is not sexually active since 1 month.  On no contraception. Last Depo provera >2 years.      The following portions of the patient's history were reviewed and updated as appropriate: allergies, current medications, past family history, past medical history, past social history, past surgical history and problem list.  Review of Systems:  Pertinent items are noted in HPI.  Objective:  Physical Exam Blood pressure 121/68, pulse 78, temperature 98.4 F (36.9 C), temperature source Oral, height 5\' 4"  (1.626 m), weight 120 lb 14.4 oz (54.84 kg), last menstrual period 08/05/2015. Gen: NAD Abd: Soft, nontender and nondistended Pelvic: Normal appearing external genitalia; normal appearing vaginal mucosa and cervix.  Normal discharge.  Small uterus, no other palpable masses, no uterine or adnexal tenderness  Labs and Imaging No results found.  Assessment & Plan:  AUB- ? Etiology.  No current STI  Pelvic sono Sprintec 1 po q day F/u in 3 months. Needs annual/PAP at that visit.  Jes Costales L. Harraway-Smith, M.D., Evern CoreFACOG

## 2015-08-27 ENCOUNTER — Ambulatory Visit (HOSPITAL_COMMUNITY)
Admission: RE | Admit: 2015-08-27 | Discharge: 2015-08-27 | Disposition: A | Discharge status: Home / Self Care | Payer: Medicaid Other | Source: Ambulatory Visit | Attending: Obstetrics & Gynecology | Admitting: Obstetrics & Gynecology

## 2015-08-27 DIAGNOSIS — R102 Pelvic and perineal pain: Secondary | ICD-10-CM

## 2015-08-29 ENCOUNTER — Telehealth: Payer: Self-pay

## 2015-08-29 NOTE — Telephone Encounter (Signed)
Per Dr. Burnice LoganHarraway- [name], please inform pt Kim Harvey results are normal.  LM to return call to the Clinics.

## 2015-08-29 NOTE — Telephone Encounter (Signed)
Pt informed that results are normal.

## 2015-10-10 ENCOUNTER — Encounter (HOSPITAL_COMMUNITY): Payer: Self-pay | Admitting: Emergency Medicine

## 2015-10-10 ENCOUNTER — Emergency Department (HOSPITAL_COMMUNITY)
Admission: EM | Admit: 2015-10-10 | Discharge: 2015-10-11 | Disposition: A | Discharge status: Home / Self Care | Payer: Medicaid Other | Attending: Emergency Medicine | Admitting: Emergency Medicine

## 2015-10-10 DIAGNOSIS — Z79818 Long term (current) use of other agents affecting estrogen receptors and estrogen levels: Secondary | ICD-10-CM | POA: Insufficient documentation

## 2015-10-10 DIAGNOSIS — J019 Acute sinusitis, unspecified: Secondary | ICD-10-CM | POA: Insufficient documentation

## 2015-10-10 DIAGNOSIS — H6691 Otitis media, unspecified, right ear: Secondary | ICD-10-CM | POA: Insufficient documentation

## 2015-10-10 DIAGNOSIS — Z8619 Personal history of other infectious and parasitic diseases: Secondary | ICD-10-CM | POA: Insufficient documentation

## 2015-10-10 DIAGNOSIS — R011 Cardiac murmur, unspecified: Secondary | ICD-10-CM | POA: Insufficient documentation

## 2015-10-10 DIAGNOSIS — F17210 Nicotine dependence, cigarettes, uncomplicated: Secondary | ICD-10-CM | POA: Insufficient documentation

## 2015-10-10 NOTE — ED Notes (Signed)
Pt states she has really bad allergies and takes zyrtec  Pt states this morning she was very congested and went to the pharmacy and got some suphedrine PE and tonight her mom gave her some nasal spray  Pt states shortly after using that her right ear started hurting really bad

## 2015-10-11 MED ORDER — PENICILLIN V POTASSIUM 500 MG PO TABS: 500.0000 mg | ORAL_TABLET | Freq: Three times a day (TID) | ORAL | Status: DC

## 2015-10-11 MED ORDER — TRAMADOL HCL 50 MG PO TABS: 50.0000 mg | ORAL_TABLET | Freq: Four times a day (QID) | ORAL | Status: DC | PRN

## 2015-10-11 MED ORDER — TRAMADOL HCL 50 MG PO TABS
50.0000 mg | ORAL_TABLET | Freq: Once | ORAL | Status: AC
  Administered 2015-10-11: 50 mg via ORAL
  Filled 2015-10-11: qty 1

## 2015-10-11 MED ORDER — ONDANSETRON 4 MG PO TBDP
4.0000 mg | ORAL_TABLET | Freq: Once | ORAL | Status: AC
  Administered 2015-10-11: 4 mg via ORAL
  Filled 2015-10-11: qty 1

## 2015-10-11 MED ORDER — PENICILLIN V POTASSIUM 500 MG PO TABS
500.0000 mg | ORAL_TABLET | Freq: Once | ORAL | Status: AC
  Administered 2015-10-11: 500 mg via ORAL
  Filled 2015-10-11: qty 1

## 2015-10-11 MED ORDER — ONDANSETRON 4 MG PO TBDP: 4.0000 mg | ORAL_TABLET | Freq: Three times a day (TID) | ORAL | Status: DC | PRN

## 2015-10-11 NOTE — ED Provider Notes (Signed)
CSN: [id number]     Arrival date & time 10/10/15  2253 History   First MD Initiated Contact with Patient 10/11/15 0034     Chief Complaint  Patient presents with  . Nasal Congestion  . Otalgia     (Consider location/radiation/quality/duration/timing/severity/associated sxs/prior Treatment) HPI   Patient has a past medical history of headache, heart murmur, trichomoniasis presents to the emergency department with complaints of allergies, sore throat, nasal congestion and severe right ear pain. She reports being congested over the past week and has tried over-the-counter pseudoephedrine PE, nasal spray insert text without any relief. While blowing her nose this evening she developed severe right sided ear pain that would not resolve with Tylenol or Motrin. She denies having any fevers, nausea, vomiting, diarrhea, back pain, cough, neck pain, confusion. She does endorse having some nausea.  Past Medical History  Diagnosis Date  . Headache(784.0)   . Heart murmur   . Trichimoniasis   . H/O chlamydia infection    Past Surgical History  Procedure Laterality Date  . Root canal    . Wisdom tooth extraction     Family History  Problem Relation Age of Onset  . Asthma Mother   . Hypertension Mother   . Asthma Father   . Heart disease Father   . Hypertension Father   . Hyperlipidemia Father   . Vision loss Father   . Anesthesia problems Neg Hx   . Hypotension Neg Hx   . Malignant hyperthermia Neg Hx   . Pseudochol deficiency Neg Hx   . Other Neg Hx    Social History  Substance Use Topics  . Smoking status: Current Every Day Smoker -- 0.50 packs/day for 6 years    Types: Cigarettes  . Smokeless tobacco: Never Used  . Alcohol Use: No   OB History    Gravida Para Term Preterm AB TAB SAB Ectopic Multiple Living   2 2 2  0 0 0 0 0 0 2     Review of Systems  Review of Systems All other systems negative except as documented in the HPI. All pertinent positives and negatives as  reviewed in the HPI.   Allergies  Review of patient's allergies indicates no known allergies.  Home Medications   Prior to Admission medications   Medication Sig Start Date End Date Taking? Authorizing Provider  acetaminophen (TYLENOL) 500 MG tablet Take 1,000 mg by mouth every 6 (six) hours as needed (for pain.).    Historical Provider, MD  aspirin-acetaminophen-caffeine (EXCEDRIN MIGRAINE) [phone] MG tablet Take 2 tablets by mouth every 6 (six) hours as needed for headache.    Historical Provider, MD  ibuprofen (ADVIL,MOTRIN) 200 MG tablet Take 200 mg by mouth every 6 (six) hours as needed.    Historical Provider, MD  norgestimate-ethinyl estradiol (ORTHO-CYCLEN,SPRINTEC,PREVIFEM) 0.25-35 MG-MCG tablet Take 1 tablet by mouth daily. 08/14/15   Willodean Rosenthalarolyn Harraway-Smith, MD  ondansetron (ZOFRAN ODT) 4 MG disintegrating tablet Take 1 tablet (4 mg total) by mouth every 8 (eight) hours as needed for nausea or vomiting. 10/11/15   Marlon Peliffany Hadas Jessop, PA-C  penicillin v potassium (VEETID) 500 MG tablet Take 1 tablet (500 mg total) by mouth 3 (three) times daily. 10/11/15   Keyona Emrich Neva SeatGreene, PA-C  traMADol (ULTRAM) 50 MG tablet Take 1 tablet (50 mg total) by mouth every 6 (six) hours as needed. 10/11/15   Leonidus Rowand Neva SeatGreene, PA-C   BP 124/77 mmHg  Pulse 101  Temp(Src) 99.4 F (37.4 C) (Oral)  Resp 18  Ht 5'  4" (1.626 m)  Wt 56.518 kg  BMI 21.38 kg/m2  SpO2 100%  LMP 09/29/2015 (Approximate) Physical Exam  Constitutional: She is oriented to person, place, and time. She appears well-developed and well-nourished. No distress.  HENT:  Head: Normocephalic and atraumatic. Head is without abrasion, without contusion, without right periorbital erythema and without left periorbital erythema.  Right Ear: Ear canal normal. There is tenderness. Tympanic membrane is injected and erythematous.  Left Ear: Tympanic membrane, external ear and ear canal normal.  Nose: No rhinorrhea. Right sinus exhibits maxillary sinus  tenderness and frontal sinus tenderness. Left sinus exhibits maxillary sinus tenderness and frontal sinus tenderness.  Mouth/Throat: Uvula is midline and mucous membranes are normal. No trismus in the jaw. Normal dentition. No dental abscesses or uvula swelling. No oropharyngeal exudate, posterior oropharyngeal edema, posterior oropharyngeal erythema or tonsillar abscesses.  No submental edema, tongue not elevated, no trismus. No impending airway obstruction; Pt able to speak full sentences, swallow intact, no drooling, stridor, or tonsillar/uvula displacement. No palatal petechia  Eyes: Conjunctivae are normal.  Neck: Trachea normal, normal range of motion and full passive range of motion without pain. Neck supple. No rigidity. Normal range of motion present. No Brudzinski's sign noted.  Flexion and extension of neck without pain or difficulty. Able to breath without difficulty in extension.  Cardiovascular: Normal rate and regular rhythm.   Pulmonary/Chest: Effort normal and breath sounds normal. No stridor. No respiratory distress. She has no wheezes.  Abdominal: Soft. There is no tenderness.  No obvious evidence of splenomegaly. Non ttp.   Musculoskeletal: Normal range of motion.  Lymphadenopathy:       Head (right side): No preauricular and no posterior auricular adenopathy present.       Head (left side): No preauricular and no posterior auricular adenopathy present.    She has cervical adenopathy.  Neurological: She is alert and oriented to person, place, and time.  Skin: Skin is warm and dry. No rash noted. She is not diaphoretic.  Psychiatric: She has a normal mood and affect.  Nursing note and vitals reviewed.   ED Course  Procedures (including critical care time) Labs Review Labs Reviewed - No data to display  Imaging Review No results found. I have personally reviewed and evaluated these images and lab results as part of my medical decision-making.   EKG  Interpretation None      MDM   Final diagnoses:  Acute right otitis media, recurrence not specified, unspecified otitis media type  Acute sinusitis, recurrence not specified, unspecified location    Rx: Zofran, Ultram, Penicillin  Patient presents with otalgia and exam consistent with acute otitis media. No concern for acute mastoiditis, meningitis.  Advised patient to follow-up with ENT.  I have also discussed reasons to return immediately to the ER.  Patient expresses understanding and agrees with plan. Pt appears safe for discharge.  Medications  ondansetron (ZOFRAN-ODT) disintegrating tablet 4 mg (not administered)  traMADol (ULTRAM) tablet 50 mg (not administered)  penicillin v potassium (VEETID) tablet 500 mg (not administered)    I discussed results, diagnoses and plan with Percival Spanish. They voice there understanding and questions were answered. We discussed follow-up recommendations and return precautions.  Filed Vitals:   10/10/15 2258  BP: 124/77  Pulse: 101  Temp: 99.4 F (37.4 C)  Resp: [address] PA-C 10/11/15 0147  Dione Booze, MD 10/11/15 [phone]

## 2015-10-11 NOTE — Discharge Instructions (Signed)
Otitis Media, Adult  Otitis media is redness, soreness, and inflammation of the middle ear. Otitis media may be caused by allergies or, most commonly, by infection. Often it occurs as a complication of the common cold.  SIGNS AND SYMPTOMS  Symptoms of otitis media may include:   Earache.   Fever.   Ringing in your ear.   Headache.   Leakage of fluid from the ear.  DIAGNOSIS  To diagnose otitis media, your health care provider will examine your ear with an otoscope. This is an instrument that allows your health care provider to see into your ear in order to examine your eardrum. Your health care provider also will ask you questions about your symptoms.  TREATMENT   Typically, otitis media resolves on its own within 3-5 days. Your health care provider may prescribe medicine to ease your symptoms of pain. If otitis media does not resolve within 5 days or is recurrent, your health care provider may prescribe antibiotic medicines if he or she suspects that a bacterial infection is the cause.  HOME CARE INSTRUCTIONS    If you were prescribed an antibiotic medicine, finish it all even if you start to feel better.   Take medicines only as directed by your health care provider.   Keep all follow-up visits as directed by your health care provider.  SEEK MEDICAL CARE IF:   You have otitis media only in one ear, or bleeding from your nose, or both.   You notice a lump on your neck.   You are not getting better in 3-5 days.   You feel worse instead of better.  SEEK IMMEDIATE MEDICAL CARE IF:    You have pain that is not controlled with medicine.   You have swelling, redness, or pain around your ear or stiffness in your neck.   You notice that part of your face is paralyzed.   You notice that the bone behind your ear (mastoid) is tender when you touch it.  MAKE SURE YOU:    Understand these instructions.   Will watch your condition.   Will get help right away if you are not doing well or get worse.     This  information is not intended to replace advice given to you by your health care provider. Make sure you discuss any questions you have with your health care provider.     Document Released: 02/21/2004 Document Revised: 06/08/2014 Document Reviewed: 12/13/2012  Elsevier Interactive Patient Education 2016 Elsevier Inc.  Sinusitis, Adult  Sinusitis is redness, soreness, and inflammation of the paranasal sinuses. Paranasal sinuses are air pockets within the bones of your face. They are located beneath your eyes, in the middle of your forehead, and above your eyes. In healthy paranasal sinuses, mucus is able to drain out, and air is able to circulate through them by way of your nose. However, when your paranasal sinuses are inflamed, mucus and air can become trapped. This can allow bacteria and other germs to grow and cause infection.  Sinusitis can develop quickly and last only a short time (acute) or continue over a long period (chronic). Sinusitis that lasts for more than 12 weeks is considered chronic.  CAUSES  Causes of sinusitis include:   Allergies.   Structural abnormalities, such as displacement of the cartilage that separates your nostrils (deviated septum), which can decrease the air flow through your nose and sinuses and affect sinus drainage.   Functional abnormalities, such as when the small hairs (cilia) that   line your sinuses and help remove mucus do not work properly or are not present.  SIGNS AND SYMPTOMS  Symptoms of acute and chronic sinusitis are the same. The primary symptoms are pain and pressure around the affected sinuses. Other symptoms include:   Upper toothache.   Earache.   Headache.   Bad breath.   Decreased sense of smell and taste.   A cough, which worsens when you are lying flat.   Fatigue.   Fever.   Thick drainage from your nose, which often is green and may contain pus (purulent).   Swelling and warmth over the affected sinuses.  DIAGNOSIS  Your health care provider will  perform a physical exam. During your exam, your health care provider may perform any of the following to help determine if you have acute sinusitis or chronic sinusitis:   Look in your nose for signs of abnormal growths in your nostrils (nasal polyps).   Tap over the affected sinus to check for signs of infection.   View the inside of your sinuses using an imaging device that has a light attached (endoscope).  If your health care provider suspects that you have chronic sinusitis, one or more of the following tests may be recommended:   Allergy tests.   Nasal culture. A sample of mucus is taken from your nose, sent to a lab, and screened for bacteria.   Nasal cytology. A sample of mucus is taken from your nose and examined by your health care provider to determine if your sinusitis is related to an allergy.  TREATMENT  Most cases of acute sinusitis are related to a viral infection and will resolve on their own within 10 days. Sometimes, medicines are prescribed to help relieve symptoms of both acute and chronic sinusitis. These may include pain medicines, decongestants, nasal steroid sprays, or saline sprays.  However, for sinusitis related to a bacterial infection, your health care provider will prescribe antibiotic medicines. These are medicines that will help kill the bacteria causing the infection.  Rarely, sinusitis is caused by a fungal infection. In these cases, your health care provider will prescribe antifungal medicine.  For some cases of chronic sinusitis, surgery is needed. Generally, these are cases in which sinusitis recurs more than 3 times per year, despite other treatments.  HOME CARE INSTRUCTIONS   Drink plenty of water. Water helps thin the mucus so your sinuses can drain more easily.   Use a humidifier.   Inhale steam 3-4 times a day (for example, sit in the bathroom with the shower running).   Apply a warm, moist washcloth to your face 3-4 times a day, or as directed by your health care  provider.   Use saline nasal sprays to help moisten and clean your sinuses.   Take medicines only as directed by your health care provider.   If you were prescribed either an antibiotic or antifungal medicine, finish it all even if you start to feel better.  SEEK IMMEDIATE MEDICAL CARE IF:   You have increasing pain or severe headaches.   You have nausea, vomiting, or drowsiness.   You have swelling around your face.   You have vision problems.   You have a stiff neck.   You have difficulty breathing.     This information is not intended to replace advice given to you by your health care provider. Make sure you discuss any questions you have with your health care provider.     Document Released: 05/18/2005 Document Revised:   06/08/2014 Document Reviewed: 06/02/2011  Elsevier Interactive Patient Education 2016 Else[employer] Inc.

## 2015-11-05 ENCOUNTER — Emergency Department (HOSPITAL_COMMUNITY)
Admission: EM | Admit: 2015-11-05 | Discharge: 2015-11-05 | Disposition: A | Discharge status: Home / Self Care | Payer: Medicaid Other | Attending: Emergency Medicine | Admitting: Emergency Medicine

## 2015-11-05 ENCOUNTER — Encounter (HOSPITAL_COMMUNITY): Payer: Self-pay | Admitting: Emergency Medicine

## 2015-11-05 DIAGNOSIS — F17210 Nicotine dependence, cigarettes, uncomplicated: Secondary | ICD-10-CM | POA: Insufficient documentation

## 2015-11-05 DIAGNOSIS — Y92007 Garden or yard of unspecified non-institutional (private) residence as the place of occurrence of the external cause: Secondary | ICD-10-CM | POA: Insufficient documentation

## 2015-11-05 DIAGNOSIS — Z7982 Long term (current) use of aspirin: Secondary | ICD-10-CM | POA: Insufficient documentation

## 2015-11-05 DIAGNOSIS — W57XXXA Bitten or stung by nonvenomous insect and other nonvenomous arthropods, initial encounter: Secondary | ICD-10-CM | POA: Insufficient documentation

## 2015-11-05 DIAGNOSIS — Y9389 Activity, other specified: Secondary | ICD-10-CM | POA: Insufficient documentation

## 2015-11-05 DIAGNOSIS — Y999 Unspecified external cause status: Secondary | ICD-10-CM | POA: Insufficient documentation

## 2015-11-05 DIAGNOSIS — S40861A Insect bite (nonvenomous) of right upper arm, initial encounter: Secondary | ICD-10-CM | POA: Insufficient documentation

## 2015-11-05 MED ORDER — EPINEPHRINE 0.3 MG/0.3ML IJ SOAJ: 0.3000 mg | Freq: Once | INTRAMUSCULAR | Status: AC

## 2015-11-05 MED ORDER — PREDNISONE 20 MG PO TABS
60.0000 mg | ORAL_TABLET | Freq: Once | ORAL | Status: AC
  Administered 2015-11-05: 60 mg via ORAL
  Filled 2015-11-05: qty 3

## 2015-11-05 NOTE — ED Notes (Signed)
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  when she was cleaning out yard while ago got stung by one on rigth arm. Patient has slight erythema to anterior arm with little burning.

## 2015-11-05 NOTE — ED Provider Notes (Signed)
CSN: [id number]     Arrival date & time 11/05/15  1603 History  By signing my name below, I, Kim Harvey, attest that this documentation has been prepared under the direction and in the presence of Audry Piliyler Aneesah Hernan, PA-C Electronically Signed: Soijett Harvey, ED Scribe. 11/05/2015. 5:02 PM.   Chief Complaint  Patient presents with  . Allergic Reaction  . Insect Bite    The history is provided by the patient. No language interpreter was used.   HPI Comments: Kim Harvey is a 25 y.o. female who presents to the Emergency Department complaining of allergic reaction onset 1 hour ago. 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 Pt was cleaning her yard today when she was stung by a wasp to her right upper arm and there is a throbbing sensation that she rates 7-8/10 at this time. Pt notes that she has burning to the site. Denies having an Epipen due to losing it while moving 1 year ago. She states that she is having associated symptoms of redness to site. She states that she has tried benadryl for the relief for her symptoms. She denies any other symptoms.   Past Medical History  Diagnosis Date  . Headache(784.0)   . Heart murmur   . Trichimoniasis   . H/O chlamydia infection    Past Surgical History  Procedure Laterality Date  . Root canal    . Wisdom tooth extraction     Family History  Problem Relation Age of Onset  . Asthma Mother   . Hypertension Mother   . Asthma Father   . Heart disease Father   . Hypertension Father   . Hyperlipidemia Father   . Vision loss Father   . Anesthesia problems Neg Hx   . Hypotension Neg Hx   . Malignant hyperthermia Neg Hx   . Pseudochol deficiency Neg Hx   . Other Neg Hx    Social History  Substance Use Topics  . Smoking status: Current Every Day Smoker -- 0.50 packs/day for 6 years    Types: Cigarettes  . Smokeless tobacco: Never Used   . Alcohol Use: No   OB History    Gravida Para Term Preterm AB TAB SAB Ectopic Multiple Living   2 2 2  0 0 0 0 0 0 2     Review of Systems  Constitutional: Negative for fever.  Musculoskeletal: Positive for myalgias. Negative for joint swelling.  Skin: Positive for color change.   Allergies  Review of patient's allergies indicates no known allergies.  Home Medications   Prior to Admission medications   Medication Sig Start Date End Date Taking? Authorizing Provider  acetaminophen (TYLENOL) 500 MG tablet Take 1,000 mg by mouth every 6 (six) hours as needed (for pain.).    Historical Provider, MD  aspirin-acetaminophen-caffeine (EXCEDRIN MIGRAINE) [phone] MG tablet Take 2 tablets by mouth every 6 (six) hours as needed for headache.    Historical Provider, MD  ibuprofen (ADVIL,MOTRIN) 200 MG tablet Take 200 mg by mouth every 6 (six) hours as needed.    Historical Provider, MD  norgestimate-ethinyl estradiol (ORTHO-CYCLEN,SPRINTEC,PREVIFEM) 0.25-35 MG-MCG tablet Take 1 tablet by mouth daily. 08/14/15   Willodean Rosenthalarolyn Harraway-Smith, MD  ondansetron (ZOFRAN ODT) 4 MG disintegrating tablet Take 1 tablet (4 mg total) by mouth every 8 (eight) hours as needed for nausea or vomiting. 10/11/15   Tiffany  Neva Seat, PA-C  penicillin v potassium (VEETID) 500 MG tablet Take 1 tablet (500 mg total) by mouth 3 (three) times daily. 10/11/15   Tiffany Neva Seat, PA-C  traMADol (ULTRAM) 50 MG tablet Take 1 tablet (50 mg total) by mouth every 6 (six) hours as needed. 10/11/15   Tiffany Neva Seat, PA-C   BP 127/85 mmHg  Pulse 71  Temp(Src) 98.6 F (37 C) (Oral)  Resp 18  SpO2 100%  LMP 11/05/2015   Physical Exam  Constitutional: She is oriented to person, place, and time. She appears well-developed and well-nourished. No distress.  HENT:  Head: Normocephalic and atraumatic.  Eyes: EOM are normal.  Neck: Neck supple.  Cardiovascular: Normal rate.   Pulmonary/Chest: Effort normal. No respiratory distress.  Able  to phonate without difficulty. No airway compromise.   Abdominal: She exhibits no distension.  Musculoskeletal: Normal range of motion.  Neurological: She is alert and oriented to person, place, and time.  Skin: Skin is warm and dry.  No angioedema. Diffuse urticarial rash, no hives. Mild erythema on right anterior bicep. No swelling noted. No induration. No signs of infection.. NVI.  Psychiatric: She has a normal mood and affect. Her behavior is normal.  Nursing note and vitals reviewed.   ED Course  Procedures (including critical care time) DIAGNOSTIC STUDIES: Oxygen Saturation is 100% on RA, nl by my interpretation.    COORDINATION OF CARE: 4:58 PM Discussed treatment plan with pt at bedside which includes benadryl, prednisone, and pt agreed to plan.  Labs Review Labs Reviewed - No data to display  Imaging Review No results found.   EKG Interpretation None      MDM   I have reviewed the relevant previous healthcare records. I obtained HPI from historian.   ED Course:  Assessment: Patient re-evaluated prior to dc, is hemodynamically stable, in no respiratory distress, and denies the feeling of throat closing, normal phonation. No wheezing, no vomiting, no syncope. Discussed signs and symptoms of anaphylaxis and severe allergic reaction. Pt advised to return for any worsening in symptoms or any concerns. Pt treated with benadryl and prednisone. Pt is to follow up with their PCP. Refilled Epi Pen Rx. Pt is agreeable with plan & verbalizes understanding.  Disposition/Plan:  DC Home Additional Verbal discharge instructions given and discussed with patient.  Pt Instructed to f/u with PCP in the next week for evaluation and treatment of symptoms. Return precautions given Pt acknowledges and agrees with plan  Supervising Physician Lorre Nick, MD  I personally performed the services described in this documentation, which was scribed in my presence. The recorded information  has been reviewed and is accurate.   Final diagnoses:  Insect bite        Audry Pili, PA-C 11/05/15 1704  Lorre Nick, MD 11/06/15 [phone]

## 2015-11-05 NOTE — Discharge Instructions (Signed)
Please read and follow all provided instructions.  Your diagnoses today include:  1. Insect bite    Tests performed today include:  Vital signs. See below for your results today.   Medications prescribed:   Take as prescribed   Home care instructions:  Follow any educational materials contained in this packet.  Follow-up instructions: Please follow-up with your primary care provider for further evaluation of symptoms and treatment   Return instructions:   Please return to the Emergency Department if you do not get better, if you get worse, or new symptoms OR  - Fever (temperature greater than 101.48F)  - Bleeding that does not stop with holding pressure to the area    -Severe pain (please note that you may be more sore the day after your accident)  - Chest Pain  - Difficulty breathing  - Severe nausea or vomiting  - Inability to tolerate food and liquids  - Passing out  - Skin becoming red around your wounds  - Change in mental status (confusion or lethargy)  - New numbness or weakness     Please return if you have any other emergent concerns.  Additional Information:  Your vital signs today were: BP 127/85 mmHg   Pulse 71   Temp(Src) 98.6 F (37 C) (Oral)   Resp 18   SpO2 100%   LMP 11/05/2015 If your blood pressure (BP) was elevated above 135/85 this visit, please have this repeated by your doctor within one month. ---------------

## 2015-11-05 NOTE — Progress Notes (Signed)
Pt states she had medicaid that expired but she recently renewed medicaid but has not obtained a new card Cm offered pt a list of medicaid [place] providers and the [place] resources  ED registration encouraged pt to call back with medicaid info to provided to ED registration if it becomes available  CM spoke with pt who [place].  CM discussed and provided written information to assist pt with determining choice for uninsured accepting pcps, discussed the importance of pcp vs EDP services for f/u care, www.needymeds.org, www.goodrx.com, discounted pharmacies and other [place] resources such as [facility] , [facility], affordable care act, financial assistance, uninsured dental services, [place] med assist, DSS and  health department  Reviewed resources for [place] uninsured accepting pcps like [facility], [facility] [address], [facility], [facility], local urgent care centers, [facility], [facility], general medical clinics, [facility], [facility] urgent care plus others, medication resources, [facility] out [facility]ies and housing Pt voiced understanding and appreciation of resources provided  Female visitor at her bedside Encouraged use of goodrx.com    Provided [facility] contact information Pt unsure if she will have medicaid and would not be a [facility] candidate if she gets [employer]

## 2015-12-30 ENCOUNTER — Emergency Department (HOSPITAL_COMMUNITY)
Admission: EM | Admit: 2015-12-30 | Discharge: 2015-12-30 | Disposition: A | Discharge status: Home / Self Care | Payer: Medicaid Other | Attending: Dermatology | Admitting: Dermatology

## 2015-12-30 ENCOUNTER — Encounter (HOSPITAL_COMMUNITY): Payer: Self-pay | Admitting: Emergency Medicine

## 2015-12-30 DIAGNOSIS — Z5321 Procedure and treatment not carried out due to patient leaving prior to being seen by health care provider: Secondary | ICD-10-CM | POA: Insufficient documentation

## 2015-12-30 DIAGNOSIS — N898 Other specified noninflammatory disorders of vagina: Secondary | ICD-10-CM | POA: Insufficient documentation

## 2015-12-30 NOTE — ED Notes (Signed)
Patient called to room, no answer x1

## 2015-12-30 NOTE — ED Notes (Signed)
Pt called from lobby for blood draw No response

## 2015-12-30 NOTE — ED Triage Notes (Signed)
Pt complaint of vaginal discharge with odor or color for a few days; pt reports associated lower abdominal and lower back pain.

## 2016-04-03 ENCOUNTER — Emergency Department (HOSPITAL_COMMUNITY)
Admission: EM | Admit: 2016-04-03 | Discharge: 2016-04-03 | Disposition: A | Discharge status: Home / Self Care | Payer: Medicaid Other | Attending: Emergency Medicine | Admitting: Emergency Medicine

## 2016-04-03 ENCOUNTER — Encounter (HOSPITAL_COMMUNITY): Payer: Self-pay | Admitting: Emergency Medicine

## 2016-04-03 DIAGNOSIS — F17210 Nicotine dependence, cigarettes, uncomplicated: Secondary | ICD-10-CM | POA: Insufficient documentation

## 2016-04-03 DIAGNOSIS — T7840XA Allergy, unspecified, initial encounter: Secondary | ICD-10-CM

## 2016-04-03 DIAGNOSIS — Z7982 Long term (current) use of aspirin: Secondary | ICD-10-CM | POA: Insufficient documentation

## 2016-04-03 DIAGNOSIS — L509 Urticaria, unspecified: Secondary | ICD-10-CM

## 2016-04-03 DIAGNOSIS — W4904XA Ring or other jewelry causing external constriction, initial encounter: Secondary | ICD-10-CM

## 2016-04-03 DIAGNOSIS — L500 Allergic urticaria: Secondary | ICD-10-CM | POA: Insufficient documentation

## 2016-04-03 MED ORDER — DIPHENHYDRAMINE HCL 25 MG PO TABS: 50.0000 mg | ORAL_TABLET | Freq: Four times a day (QID) | ORAL | 0 refills | Status: DC | PRN

## 2016-04-03 MED ORDER — DIPHENHYDRAMINE HCL 25 MG PO CAPS
50.0000 mg | ORAL_CAPSULE | Freq: Once | ORAL | Status: AC
  Administered 2016-04-03: 50 mg via ORAL
  Filled 2016-04-03: qty 2

## 2016-04-03 MED ORDER — PREDNISONE 20 MG PO TABS
60.0000 mg | ORAL_TABLET | Freq: Once | ORAL | Status: AC
  Administered 2016-04-03: 60 mg via ORAL
  Filled 2016-04-03: qty 3

## 2016-04-03 MED ORDER — PREDNISONE 20 MG PO TABS: ORAL_TABLET | ORAL | 0 refills | Status: DC

## 2016-04-03 NOTE — ED Provider Notes (Addendum)
[facility]-EMERGENCY DEPT Provider Note   CSN: [id number] Arrival date & time: 04/03/16  0847     History   Chief Complaint Chief Complaint  Patient presents with  . Rash    HPI Kim Harvey is a 25 y.o. female.  HPI The patient has had a rash waxing waning for a weeks time. Rash has been hives in appearance. It has been of variable areas including her back and extremities. She reports her hands are swollen and itchy. No associated cough, shortness of breath, general illness, fever or malaise. She reports she did get a new tattoo about 2 weeks ago. She used Monistat about one week ago. Other than that she cannot think of any new products or medications that might of caused the rash. She denies history of similar. She reports her fingers have gotten swollen and she cannot remove her rings now. She has tried Benadryl and gotten some relief intermittently. Past Medical History:  Diagnosis Date  . H/O chlamydia infection   . Headache(784.0)   . Heart murmur   . Trichimoniasis     Patient Active Problem List   Diagnosis Date Noted  . Normal delivery 12/21/2011    Past Surgical History:  Procedure Laterality Date  . ROOT CANAL    . WISDOM TOOTH EXTRACTION      OB History    Gravida Para Term Preterm AB Living   2 2 2  0 0 2   SAB TAB Ectopic Multiple Live Births   0 0 0 0 2       Home Medications    Prior to Admission medications   Medication Sig Start Date End Date Taking? Authorizing Provider  acetaminophen (TYLENOL) 500 MG tablet Take 1,000 mg by mouth every 6 (six) hours as needed (for pain.).    Historical Provider, MD  aspirin-acetaminophen-caffeine (EXCEDRIN MIGRAINE) [phone] MG tablet Take 2 tablets by mouth every 6 (six) hours as needed for headache.    Historical Provider, MD  diphenhydrAMINE (BENADRYL) 25 MG tablet Take 2 tablets (50 mg total) by mouth every 6 (six) hours as needed. 04/03/16   Arby Barrette, MD  EPINEPHrine 0.3 mg/0.3 mL IJ SOAJ injection  Inject 0.3 mLs (0.3 mg total) into the muscle once. 11/05/15   Audry Pili, PA-C  ibuprofen (ADVIL,MOTRIN) 200 MG tablet Take 200 mg by mouth every 6 (six) hours as needed.    Historical Provider, MD  norgestimate-ethinyl estradiol (ORTHO-CYCLEN,SPRINTEC,PREVIFEM) 0.25-35 MG-MCG tablet Take 1 tablet by mouth daily. 08/14/15   Willodean Rosenthal, MD  ondansetron (ZOFRAN ODT) 4 MG disintegrating tablet Take 1 tablet (4 mg total) by mouth every 8 (eight) hours as needed for nausea or vomiting. 10/11/15   Marlon Pel, PA-C  penicillin v potassium (VEETID) 500 MG tablet Take 1 tablet (500 mg total) by mouth 3 (three) times daily. 10/11/15   Tiffany Neva Seat, PA-C  predniSONE (DELTASONE) 20 MG tablet 3 tabs po daily x 3 days, then 2 tabs x 3 days, then 1.5 tabs x 3 days, then 1 tab x 3 days, then 0.5 tabs x 3 days 04/03/16   Arby Barrette, MD  traMADol (ULTRAM) 50 MG tablet Take 1 tablet (50 mg total) by mouth every 6 (six) hours as needed. 10/11/15   Marlon Pel, PA-C    Family History Family History  Problem Relation Age of Onset  . Asthma Mother   . Hypertension Mother   . Asthma Father   . Heart disease Father   . Hypertension Father   .  Hyperlipidemia Father   . Vision loss Father   . Anesthesia problems Neg Hx   . Hypotension Neg Hx   . Malignant hyperthermia Neg Hx   . Pseudochol deficiency Neg Hx   . Other Neg Hx     Social History Social History  Substance Use Topics  . Smoking status: Current Every Day Smoker    Packs/day: 0.50    Years: 6.00    Types: Cigarettes  . Smokeless tobacco: Never Used  . Alcohol use No     Allergies   Review of patient's allergies indicates no known allergies.   Review of Systems Review of Systems 10 Systems reviewed and are negative for acute change except as noted in the HPI.   Physical Exam Updated Vital Signs BP 119/77 (BP Location: Left Arm)   Pulse 100   Temp 97.3 F (36.3 C) (Oral)   Resp 14   Ht 5\' 4"  (1.626 m)   Wt 125  lb (56.7 kg)   LMP 03/15/2016   SpO2 100%   BMI 21.46 kg/m   Physical Exam  Constitutional: She is oriented to person, place, and time. She appears well-developed and well-nourished. No distress.  No distress. Patient is alert and well appearance.  HENT:  Head: Normocephalic and atraumatic.  Mouth/Throat: Oropharynx is clear and moist.  Eyes: Conjunctivae and EOM are normal. Pupils are equal, round, and reactive to light. Right eye exhibits no discharge. Left eye exhibits no discharge.  Neck: Neck supple.  Cardiovascular: Normal rate and regular rhythm.   No murmur heard. Pulmonary/Chest: Effort normal and breath sounds normal. No respiratory distress.  Abdominal: She exhibits no distension.  Musculoskeletal: Normal range of motion. She exhibits edema.  Bilateral hands have some puffy edema. This is causing rings to be quite snug. They are causing imprints on the skin but no ischemic changes. The fingers distally are warm and dry with good perfusion. Otherwise normal range of motion and no joint tenderness.  Neurological: She is alert and oriented to person, place, and time. She exhibits normal muscle tone. Coordination normal.  Skin: Skin is warm and dry. Rash noted.  Patient has urticaria at multiple sites. Head and face are free of lesions. Lower back has a distribution around the waistline and lower back of urticarial lesions anywhere from about half a centimeter to 2 cm in size. Similar in the groin and upper medial legs. Also urticarial rash of various plaques on the lower legs.  Psychiatric: She has a normal mood and affect.  Nursing note and vitals reviewed.    ED Treatments / Results  Labs (all labs ordered are listed, but only abnormal results are displayed) Labs Reviewed - No data to display  EKG  EKG Interpretation None       Radiology No results found.  Procedures Procedures (including critical care time)  Medications Ordered in ED Medications  predniSONE  (DELTASONE) tablet 60 mg (60 mg Oral Given 04/03/16 0952)  diphenhydrAMINE (BENADRYL) capsule 50 mg (50 mg Oral Given 04/03/16 0951)     Initial Impression / Assessment and Plan / ED Course  I have reviewed the triage vital signs and the nursing notes.  Pertinent labs & imaging results that were available during my care of the patient were reviewed by me and considered in my medical decision making (see chart for details).  Clinical Course    Final Clinical Impressions(s) / ED Diagnoses   Final diagnoses:  Urticaria  Allergic reaction, initial encounter  Ring or other  jewelry causing external constriction, initial encounter   Patient has urticarial rash. This appears consistent with likely allergy of uncertain etiology. Patient does have a large, nor tattoo from 2 weeks earlier. This is over her central lower back with a black\blue dye. The condition of the skin is good however with no signs of secondary infection. This may be incidental timing. The patient is otherwise well appearance and has no other complications of oral swelling or respiratory symptoms. At this time I will treat with prednisone and Benadryl. Due to swelling of her hands several rings have become quite snug and I'm concerned that if she has continued swelling she will be at risk for injury or ischemia from the rings. We will plan to remove them. Patient is agreeable with this and reports she will get them repaired later. New Prescriptions New Prescriptions   DIPHENHYDRAMINE (BENADRYL) 25 MG TABLET    Take 2 tablets (50 mg total) by mouth every 6 (six) hours as needed.   PREDNISONE (DELTASONE) 20 MG TABLET    3 tabs po daily x 3 days, then 2 tabs x 3 days, then 1.5 tabs x 3 days, then 1 tab x 3 days, then 0.5 tabs x 3 days     Arby BarretteMarcy Amahia Madonia, MD 04/03/16 [id number]    Arby BarretteMarcy Kynlea Blackston, MD 04/03/16 [phone]

## 2016-04-03 NOTE — ED Notes (Signed)
Three gold rings cut from patients fingers and given to patient.

## 2016-04-03 NOTE — ED Triage Notes (Addendum)
Pt reports generalized itching body rash x1 week. Pt also reports bilateral hand swelling and "stinging." Pt speaking in full sentences. Denies throat swelling and SOB.

## 2016-07-08 ENCOUNTER — Encounter (HOSPITAL_COMMUNITY): Payer: Self-pay | Admitting: *Deleted

## 2016-07-08 ENCOUNTER — Inpatient Hospital Stay (HOSPITAL_COMMUNITY)
Admission: AD | Admit: 2016-07-08 | Discharge: 2016-07-08 | Disposition: A | Discharge status: Home / Self Care | Payer: Medicaid Other | Source: Ambulatory Visit | Attending: Obstetrics & Gynecology | Admitting: Obstetrics & Gynecology

## 2016-07-08 DIAGNOSIS — Z79899 Other long term (current) drug therapy: Secondary | ICD-10-CM | POA: Insufficient documentation

## 2016-07-08 DIAGNOSIS — Z7982 Long term (current) use of aspirin: Secondary | ICD-10-CM | POA: Insufficient documentation

## 2016-07-08 DIAGNOSIS — N898 Other specified noninflammatory disorders of vagina: Secondary | ICD-10-CM | POA: Insufficient documentation

## 2016-07-08 DIAGNOSIS — Z8249 Family history of ischemic heart disease and other diseases of the circulatory system: Secondary | ICD-10-CM | POA: Insufficient documentation

## 2016-07-08 DIAGNOSIS — Z825 Family history of asthma and other chronic lower respiratory diseases: Secondary | ICD-10-CM | POA: Insufficient documentation

## 2016-07-08 DIAGNOSIS — Z8619 Personal history of other infectious and parasitic diseases: Secondary | ICD-10-CM | POA: Insufficient documentation

## 2016-07-08 DIAGNOSIS — F17210 Nicotine dependence, cigarettes, uncomplicated: Secondary | ICD-10-CM | POA: Insufficient documentation

## 2016-07-08 DIAGNOSIS — Z113 Encounter for screening for infections with a predominantly sexual mode of transmission: Secondary | ICD-10-CM

## 2016-07-08 DIAGNOSIS — Z9889 Other specified postprocedural states: Secondary | ICD-10-CM | POA: Insufficient documentation

## 2016-07-08 DIAGNOSIS — R300 Dysuria: Secondary | ICD-10-CM | POA: Insufficient documentation

## 2016-07-08 LAB — CBC
HCT: 36 % (ref 36.0–46.0)
Hemoglobin: 12.3 g/dL (ref 12.0–15.0)
MCH: 31.7 pg (ref 26.0–34.0)
MCHC: 34.2 g/dL (ref 30.0–36.0)
MCV: 92.8 fL (ref 78.0–100.0)
PLATELETS: 193 10*3/uL (ref 150–400)
RBC: 3.88 MIL/uL (ref 3.87–5.11)
RDW: 13.1 % (ref 11.5–15.5)
WBC: 6.7 10*3/uL (ref 4.0–10.5)

## 2016-07-08 LAB — URINALYSIS, ROUTINE W REFLEX MICROSCOPIC
Bilirubin Urine: NEGATIVE
Glucose, UA: NEGATIVE mg/dL
Ketones, ur: NEGATIVE mg/dL
Nitrite: NEGATIVE
Protein, ur: NEGATIVE mg/dL
SPECIFIC GRAVITY, URINE: 1.014 (ref 1.005–1.030)
pH: 6 (ref 5.0–8.0)

## 2016-07-08 LAB — POCT PREGNANCY, URINE: PREG TEST UR: NEGATIVE

## 2016-07-08 LAB — WET PREP, GENITAL
CLUE CELLS WET PREP: NONE SEEN
Sperm: NONE SEEN
Trich, Wet Prep: NONE SEEN
YEAST WET PREP: NONE SEEN

## 2016-07-08 MED ORDER — VALACYCLOVIR HCL 1 G PO TABS: 1000.0000 mg | ORAL_TABLET | Freq: Two times a day (BID) | ORAL | 0 refills | Status: DC

## 2016-07-08 NOTE — MAU Note (Signed)
Pt presents to [facility]U with complaints of vaginal itching and burning with urination x 3 days. Denies any vaginal bleeding or pain

## 2016-07-08 NOTE — MAU Provider Note (Signed)
History     CSN: [id number]  Arrival date and time: 07/08/16 1302   First Provider Initiated Contact with Patient 07/08/16 1326      Chief Complaint  Patient presents with  . Urinary Tract Infection  . Vaginitis   Kim Harvey is a 26 y.o. G3P2002 female who presents for vaginal irritation & dysuria. Symptoms began after sitting in a jacuzzi. Unsure if pain is at urethra or from urine hitting her vulva. Pt desires STI testing during today's visit.    Female GU Problem  The patient's primary symptoms include genital itching and vaginal discharge. The patient's pertinent negatives include no genital lesions, genital odor, genital rash, missed menses, pelvic pain or vaginal bleeding. Primary symptoms comment: + vaginal irritation. This is a new problem. Episode onset: 3 days ago. The problem occurs constantly. The problem has been gradually worsening. The pain is mild. She is not pregnant. Associated symptoms include dysuria. Pertinent negatives include no abdominal pain, chills, diarrhea, fever, flank pain, frequency, hematuria, nausea, painful intercourse or vomiting. The vaginal discharge was thick, thin and yellow. There has been no bleeding. She has tried nothing for the symptoms. She is sexually active. It is unknown whether or not her partner has an STD. She uses nothing for contraception. Her menstrual history has been regular. Her past medical history is significant for an STD and vaginosis.     Past Medical History:  Diagnosis Date  . H/O chlamydia infection   . Headache(784.0)   . Heart murmur   . Trichimoniasis     Past Surgical History:  Procedure Laterality Date  . ROOT CANAL    . WISDOM TOOTH EXTRACTION      Family History  Problem Relation Age of Onset  . Asthma Mother   . Hypertension Mother   . Asthma Father   . Heart disease Father   . Hypertension Father   . Hyperlipidemia Father   . Vision loss Father   . Anesthesia problems Neg Hx   . Hypotension  Neg Hx   . Malignant hyperthermia Neg Hx   . Pseudochol deficiency Neg Hx   . Other Neg Hx     Social History  Substance Use Topics  . Smoking status: Current Every Day Smoker    Packs/day: 0.50    Years: 6.00    Types: Cigarettes  . Smokeless tobacco: Never Used  . Alcohol use No    Allergies: No Known Allergies  Prescriptions Prior to Admission  Medication Sig Dispense Refill Last Dose  . acetaminophen (TYLENOL) 500 MG tablet Take 1,000 mg by mouth every 6 (six) hours as needed (for pain.).   Taking  . aspirin-acetaminophen-caffeine (EXCEDRIN MIGRAINE) 250-250-65 MG tablet Take 2 tablets by mouth every 6 (six) hours as needed for headache.   Taking  . diphenhydrAMINE (BENADRYL) 25 MG tablet Take 2 tablets (50 mg total) by mouth every 6 (six) hours as needed. 30 tablet 0   . EPINEPHrine 0.3 mg/0.3 mL IJ SOAJ injection Inject 0.3 mLs (0.3 mg total) into the muscle once. 1 Device 0   . ibuprofen (ADVIL,MOTRIN) 200 MG tablet Take 200 mg by mouth every 6 (six) hours as needed.   Taking  . norgestimate-ethinyl estradiol (ORTHO-CYCLEN,SPRINTEC,PREVIFEM) 0.25-35 MG-MCG tablet Take 1 tablet by mouth daily. 1 Package 11   . ondansetron (ZOFRAN ODT) 4 MG disintegrating tablet Take 1 tablet (4 mg total) by mouth every 8 (eight) hours as needed for nausea or vomiting. 20 tablet 0   . penicillin  v potassium (VEETID) 500 MG tablet Take 1 tablet (500 mg total) by mouth 3 (three) times daily. 30 tablet 0   . predniSONE (DELTASONE) 20 MG tablet 3 tabs po daily x 3 days, then 2 tabs x 3 days, then 1.5 tabs x 3 days, then 1 tab x 3 days, then 0.5 tabs x 3 days 27 tablet 0   . traMADol (ULTRAM) 50 MG tablet Take 1 tablet (50 mg total) by mouth every 6 (six) hours as needed. 10 tablet 0     Review of Systems  Constitutional: Negative.  Negative for chills and fever.  Gastrointestinal: Negative.  Negative for abdominal pain, diarrhea, nausea and vomiting.  Genitourinary: Positive for dysuria, vaginal  discharge and vaginal pain. Negative for dyspareunia, flank pain, frequency, genital sores, hematuria, missed menses, pelvic pain and vaginal bleeding.   Physical Exam   Blood pressure 116/57, pulse 85, temperature 98.3 F (36.8 C), resp. rate 18, height 5\' 4"  (1.626 m), weight 128 lb (58.1 kg), last menstrual period 06/15/2016.  Physical Exam  Nursing note and vitals reviewed. Constitutional: She is oriented to person, place, and time. She appears well-developed and well-nourished. No distress.  HENT:  Head: Normocephalic and atraumatic.  Eyes: Conjunctivae are normal. Right eye exhibits no discharge. Left eye exhibits no discharge. No scleral icterus.  Neck: Normal range of motion.  Cardiovascular: Normal rate, regular rhythm and normal heart sounds.   No murmur heard. Respiratory: Effort normal and breath sounds normal. No respiratory distress. She has no wheezes.  GI: Soft. Bowel sounds are normal. She exhibits no distension. There is no tenderness. There is no rebound and no guarding.  Genitourinary: Uterus normal. There is lesion (small ulcerative lesion x 1, right labia minora just inferior to urethra) on the right labia. Cervix exhibits no motion tenderness and no friability. No erythema or bleeding in the vagina. Vaginal discharge (small amount of thin yellow discharge) found.  Neurological: She is alert and oriented to person, place, and time.  Skin: Skin is warm and dry. She is not diaphoretic.  Psychiatric: She has a normal mood and affect. Her behavior is normal. Judgment and thought content normal.    MAU Course  Procedures Results for orders placed or performed during the hospital encounter of 07/08/16 (from the past 24 hour(s))  Urinalysis, Routine w reflex microscopic     Status: Abnormal   Collection Time: 07/08/16  1:00 PM  Result Value Ref Range   Color, Urine YELLOW YELLOW   APPearance HAZY (A) CLEAR   Specific Gravity, Urine 1.014 1.005 - 1.030   pH 6.0 5.0 -  8.0   Glucose, UA NEGATIVE NEGATIVE mg/dL   Hgb urine dipstick MODERATE (A) NEGATIVE   Bilirubin Urine NEGATIVE NEGATIVE   Ketones, ur NEGATIVE NEGATIVE mg/dL   Protein, ur NEGATIVE NEGATIVE mg/dL   Nitrite NEGATIVE NEGATIVE   Leukocytes, UA LARGE (A) NEGATIVE   RBC / HPF 6-30 0 - 5 RBC/hpf   WBC, UA 0-5 0 - 5 WBC/hpf   Bacteria, UA RARE (A) NONE SEEN   Squamous Epithelial / LPF 6-30 (A) NONE SEEN   Mucous PRESENT   Pregnancy, urine POC     Status: None   Collection Time: 07/08/16  1:13 PM  Result Value Ref Range   Preg Test, Ur NEGATIVE NEGATIVE  Wet prep, genital     Status: Abnormal   Collection Time: 07/08/16  1:38 PM  Result Value Ref Range   Yeast Wet Prep HPF POC NONE SEEN  NONE SEEN   Trich, Wet Prep NONE SEEN NONE SEEN   Clue Cells Wet Prep HPF POC NONE SEEN NONE SEEN   WBC, Wet Prep HPF POC MANY (A) NONE SEEN   Sperm NONE SEEN   CBC     Status: None   Collection Time: 07/08/16  1:47 PM  Result Value Ref Range   WBC 6.7 4.0 - 10.5 K/uL   RBC 3.88 3.87 - 5.11 MIL/uL   Hemoglobin 12.3 12.0 - 15.0 g/dL   HCT 40.9 81.1 - 91.4 %   MCV 92.8 78.0 - 100.0 fL   MCH 31.7 26.0 - 34.0 pg   MCHC 34.2 30.0 - 36.0 g/dL   RDW 78.2 95.6 - 21.3 %   Platelets 193 150 - 400 K/uL    MDM UPT negative CBC, HIV, RPR, GC/CT & wet prep Lesion swabbed for HSV  Assessment and Plan  A: 1. Screen for STD (sexually transmitted disease)   2. Dysuria   3. Vaginal lesion    P: Discharge home No intercourse until STI results come back Rx valtrex while HSV culture pending GC/CT, HIV, RPR, HSV pending  Judeth Horn 07/08/2016, 1:28 PM

## 2016-07-08 NOTE — Discharge Instructions (Signed)
Dysuria Introduction Dysuria is pain or discomfort while urinating. The pain or discomfort may be felt in the tube that carries urine out of the bladder (urethra) or in the surrounding tissue of the genitals. The pain may also be felt in the groin area, lower abdomen, and lower back. You may have to urinate frequently or have the sudden feeling that you have to urinate (urgency). Dysuria can affect both men and women, but is more common in women. Dysuria can be caused by many different things, including:  Urinary tract infection in women.  Infection of the kidney or bladder.  Kidney stones or bladder stones.  Certain sexually transmitted infections (STIs), such as chlamydia.  Dehydration.  Inflammation of the vagina.  Use of certain medicines.  Use of certain soaps or scented products that cause irritation. Follow these instructions at home: Watch your dysuria for any changes. The following actions may help to reduce any discomfort you are feeling:  Drink enough fluid to keep your urine clear or pale yellow.  Empty your bladder often. Avoid holding urine for long periods of time.  After a bowel movement or urination, women should cleanse from front to back, using each tissue only once.  Empty your bladder after sexual intercourse.  Take medicines only as directed by your health care provider.  If you were prescribed an antibiotic medicine, finish it all even if you start to feel better.  Avoid caffeine, tea, and alcohol. They can irritate the bladder and make dysuria worse. In men, alcohol may irritate the prostate.  Keep all follow-up visits as directed by your health care provider. This is important.  If you had any tests done to find the cause of dysuria, it is your responsibility to obtain your test results. Ask the lab or department performing the test when and how you will get your results. Talk with your health care provider if you have any questions about your  results. Contact a health care provider if:  You develop pain in your back or sides.  You have a fever.  You have nausea or vomiting.  You have blood in your urine.  You are not urinating as often as you usually do. Get help right away if:  You pain is severe and not relieved with medicines.  You are unable to hold down any fluids.  You or someone else notices a change in your mental function.  You have a rapid heartbeat at rest.  You have shaking or chills.  You feel extremely weak. This information is not intended to replace advice given to you by your health care provider. Make sure you discuss any questions you have with your health care provider. Document Released: 02/14/2004 Document Revised: 10/24/2015 Document Reviewed: 01/11/2014  2017 Elsevier  

## 2016-07-09 LAB — HIV ANTIBODY (ROUTINE TESTING W REFLEX): HIV Screen 4th Generation wRfx: NONREACTIVE

## 2016-07-09 LAB — GC/CHLAMYDIA PROBE AMP (~~LOC~~) NOT AT ARMC
CHLAMYDIA, DNA PROBE: NEGATIVE
Neisseria Gonorrhea: NEGATIVE

## 2016-07-09 LAB — RPR: RPR Ser Ql: NONREACTIVE

## 2016-07-10 LAB — HSV CULTURE AND TYPING

## 2016-07-13 ENCOUNTER — Emergency Department (HOSPITAL_COMMUNITY)
Admission: EM | Admit: 2016-07-13 | Discharge: 2016-07-13 | Disposition: A | Discharge status: Home / Self Care | Payer: Self-pay | Attending: Emergency Medicine | Admitting: Emergency Medicine

## 2016-07-13 ENCOUNTER — Encounter (HOSPITAL_COMMUNITY): Payer: Self-pay | Admitting: Emergency Medicine

## 2016-07-13 DIAGNOSIS — N898 Other specified noninflammatory disorders of vagina: Secondary | ICD-10-CM | POA: Insufficient documentation

## 2016-07-13 DIAGNOSIS — Z79899 Other long term (current) drug therapy: Secondary | ICD-10-CM | POA: Insufficient documentation

## 2016-07-13 DIAGNOSIS — F17210 Nicotine dependence, cigarettes, uncomplicated: Secondary | ICD-10-CM | POA: Insufficient documentation

## 2016-07-13 MED ORDER — CEPHALEXIN 500 MG PO CAPS: 1000.0000 mg | ORAL_CAPSULE | Freq: Two times a day (BID) | ORAL | 0 refills | Status: DC

## 2016-07-13 NOTE — ED Notes (Signed)
Discharge instructions, follow up care, and rx x1 reviewed with patient. Patient verbalized understanding. 

## 2016-07-13 NOTE — ED Triage Notes (Signed)
patient states that [facility] and tested for STDs but hasnt heard back from them. Patient reports dysuria and vaginal itching. Patient bought OTC cream but not helping.

## 2016-07-13 NOTE — ED Notes (Signed)
Bed: WA02 Expected date:  Expected time:  Means of arrival:  Comments: 

## 2016-07-13 NOTE — ED Provider Notes (Signed)
[facility]-EMERGENCY DEPT Provider Note   CSN: [id number] Arrival date & time: 07/13/16  0710     History   Chief Complaint Chief Complaint  Patient presents with  . Vaginal Itching  . Dysuria    HPI Kim Harvey is a 26 y.o. female.  HPI   26 year old female with prior history of STD infection including chlamydia and trichomonas present complaining of vaginal discomfort. Patient reports last week she went down to [place] with her sister and her boyfriend and recall using the [place]. Several days afterward she developed itching and burning sensation to the vaginal region. No associated fever, dysuria, hematuria, vaginal discharge. [facility] States that she had a for STD panel and will be receiving a phone call today for the result. She also had a recent Pap smear. She is here due to increased discomfort which includes vaginal itching. She has tried some over-the-counter cream with minimal improvement.   Past Medical History:  Diagnosis Date  . H/O chlamydia infection   . Headache(784.0)   . Heart murmur   . Trichimoniasis     Patient Active Problem List   Diagnosis Date Noted  . Normal delivery 12/21/2011    Past Surgical History:  Procedure Laterality Date  . ROOT CANAL    . WISDOM TOOTH EXTRACTION      OB History    Gravida Para Term Preterm AB Living   2 2 2  0 0 2   SAB TAB Ectopic Multiple Live Births   0 0 0 0 2       Home Medications    Prior to Admission medications   Medication Sig Start Date End Date Taking? Authorizing Provider  acetaminophen (TYLENOL) 500 MG tablet Take 1,000 mg by mouth every 6 (six) hours as needed (for pain.).    Historical Provider, MD  diphenhydrAMINE (BENADRYL) 25 MG tablet Take 2 tablets (50 mg total) by mouth every 6 (six) hours as needed. Patient taking differently: Take 50 mg by mouth every 6 (six) hours as needed for allergies.  04/03/16   Arby Barrette, MD  EPINEPHrine 0.3  mg/0.3 mL IJ SOAJ injection Inject 0.3 mLs (0.3 mg total) into the muscle once. 11/05/15   Audry Pili, PA-C  ibuprofen (ADVIL,MOTRIN) 200 MG tablet Take 200 mg by mouth every 6 (six) hours as needed for mild pain.     Historical Provider, MD  valACYclovir (VALTREX) 1000 MG tablet Take 1 tablet (1,000 mg total) by mouth 2 (two) times daily. 07/08/16   Judeth Horn, NP    Family History Family History  Problem Relation Age of Onset  . Asthma Mother   . Hypertension Mother   . Asthma Father   . Heart disease Father   . Hypertension Father   . Hyperlipidemia Father   . Vision loss Father   . Anesthesia problems Neg Hx   . Hypotension Neg Hx   . Malignant hyperthermia Neg Hx   . Pseudochol deficiency Neg Hx   . Other Neg Hx     Social History Social History  Substance Use Topics  . Smoking status: Current Every Day Smoker    Packs/day: 0.50    Years: 6.00    Types: Cigarettes  . Smokeless tobacco: Never Used  . Alcohol use No     Allergies   Patient has no known allergies.   Review of Systems Review of Systems  All other systems reviewed and are negative.    Physical  Exam Updated Vital Signs BP 125/74 (BP Location: Left Arm)   Pulse 79   Temp 98.2 F (36.8 C) (Oral)   Resp 18   Ht 5\' 3"  (1.6 m)   Wt 59.9 kg   LMP 06/15/2016   SpO2 98%   BMI 23.38 kg/m   Physical Exam  Constitutional: She appears well-developed and well-nourished. No distress.  HENT:  Head: Atraumatic.  Eyes: Conjunctivae are normal.  Neck: Neck supple.  Cardiovascular: Normal rate and regular rhythm.   Pulmonary/Chest: Effort normal and breath sounds normal.  Abdominal: Soft. She exhibits no distension. There is no tenderness.  Genitourinary:  Genitourinary Comments: Chaperone present during exam. No lymphadenopathy or inguinal hernia noted. At the introitus of the vaginal vault there is some localized skin irritation without any blisters noted at the 3 and 9:00 position with approximately  4 mm in diameter. No obvious abscess. There is tender to palpation.  Neurological: She is alert.  Skin: No rash noted.  Psychiatric: She has a normal mood and affect.  Nursing note and vitals reviewed.    ED Treatments / Results  Labs (all labs ordered are listed, but only abnormal results are displayed) Labs Reviewed - No data to display  EKG  EKG Interpretation None       Radiology No results found.  Procedures Procedures (including critical care time)  Medications Ordered in ED Medications - No data to display   Initial Impression / Assessment and Plan / ED Course  I have reviewed the triage vital signs and the nursing notes.  Pertinent labs & imaging results that were available during my care of the patient were reviewed by me and considered in my medical decision making (see chart for details).     BP 125/74 (BP Location: Left Arm)   Pulse 79   Temp 98.2 F (36.8 C) (Oral)   Resp 18   Ht 5\' 3"  (1.6 m)   Wt 59.9 kg   LMP 06/15/2016   SpO2 98%   BMI 23.38 kg/m    Final Clinical Impressions(s) / ED Diagnoses   Final diagnoses:  Vaginal irritation    New Prescriptions New Prescriptions   CEPHALEXIN (KEFLEX) 500 MG CAPSULE    Take 2 capsules (1,000 mg total) by mouth 2 (two) times daily.   8:10 AM Patient here with localized vaginal irritation since using a Jacuzzi a week ago. [facility] I have reviewed her chart, patient has had negative testing for herpes, HIV, syphilis, gonorrhea, chlamydia, trichomonas, yeast infection, or bacterial vaginosis. On examination patient does have some localized skin irritation at the introitus of her vaginal region medial to labia minora. This could be localized cellulitis. Plan to prescribe Keflex antibiotic and have patient follow-up with her PCP for further care. Recommend avoid sexual activities until symptoms completely resolved. Return precaution discussed.   Fayrene HelperBowie Gurveer Colucci,  PA-C 07/13/16 [id number]    Derwood KaplanAnkit Nanavati, MD 07/13/16 [phone]

## 2016-07-13 NOTE — Discharge Instructions (Signed)
You have evidence of skin irritation to your vaginal region, which may lead to infection.  Take antibiotic as prescribed and follow up with your doctor for further care.

## 2016-07-15 ENCOUNTER — Encounter: Payer: Self-pay | Admitting: Advanced Practice Midwife

## 2016-07-15 ENCOUNTER — Ambulatory Visit (INDEPENDENT_AMBULATORY_CARE_PROVIDER_SITE_OTHER): Payer: Self-pay | Admitting: Advanced Practice Midwife

## 2016-07-15 VITALS — BP 114/77 | HR 75 | Ht 63.0 in | Wt 125.0 lb

## 2016-07-15 DIAGNOSIS — B373 Candidiasis of vulva and vagina: Secondary | ICD-10-CM

## 2016-07-15 DIAGNOSIS — B3731 Acute candidiasis of vulva and vagina: Secondary | ICD-10-CM

## 2016-07-15 DIAGNOSIS — Z113 Encounter for screening for infections with a predominantly sexual mode of transmission: Secondary | ICD-10-CM

## 2016-07-15 DIAGNOSIS — N898 Other specified noninflammatory disorders of vagina: Secondary | ICD-10-CM

## 2016-07-15 DIAGNOSIS — A6000 Herpesviral infection of urogenital system, unspecified: Secondary | ICD-10-CM

## 2016-07-15 MED ORDER — ACYCLOVIR 200 MG PO CAPS: 400.0000 mg | ORAL_CAPSULE | Freq: Three times a day (TID) | ORAL | 0 refills | Status: DC

## 2016-07-15 MED ORDER — FLUCONAZOLE 150 MG PO TABS: 150.0000 mg | ORAL_TABLET | Freq: Once | ORAL | 2 refills | Status: AC

## 2016-07-15 MED ORDER — LIDOCAINE HCL 2 % EX GEL: 1.0000 "application " | CUTANEOUS | 1 refills | Status: DC | PRN

## 2016-07-15 MED ORDER — ACYCLOVIR 200 MG PO CAPS: 400.0000 mg | ORAL_CAPSULE | Freq: Three times a day (TID) | ORAL | 11 refills | Status: DC

## 2016-07-15 NOTE — Patient Instructions (Signed)
Genital Herpes °Genital herpes is a common sexually transmitted infection (STI) that is caused by a virus. The virus is spread from person to person through sexual contact. Infection can cause itching, blisters, and sores in the genital area or rectal area. This is called an outbreak. It affects both men and women. °Genital herpes is particularly concerning for pregnant women because the virus can be passed to the baby during delivery and cause serious problems. Genital herpes is also a concern for people with a weakened defense (immune) system. °Symptoms of genital herpes may last several days and then go away. However, the virus remains in your body, so you may have more outbreaks of symptoms in the future. The time between outbreaks varies and can be months or years. °CAUSES °Genital herpes is caused by a virus called herpes simplex virus (HSV) type 2 or HSV type 1. These viruses are contagious and are most often spread through sexual contact with an infected person. Sexual contact includes vaginal, anal, and oral sex. °RISK FACTORS °Risk factors for genital herpes include: °· Being sexually active with multiple partners. °· Having unprotected sex. °SIGNS AND SYMPTOMS °Symptoms may include: °· Pain and itching in the genital area or rectal area. °· Small red bumps that turn into blisters and then turn into sores. °· Flu-like symptoms, including: °¨ Fever. °¨ Body aches. °· Painful urination. °· Vaginal discharge. °DIAGNOSIS °Genital herpes may be diagnosed by: °· Physical exam. °· Blood test. °· Fluid culture test from an open sore. °TREATMENT °There is no cure for genital herpes. Oral antiviral medicines may be used to speed up healing and to help prevent the return of symptoms. These medicines can also help to reduce the spread of the virus to sexual partners. °HOME CARE INSTRUCTIONS °· Keep the affected areas dry and clean. °· Take medicines only as directed by your health care provider. °· Do not have sexual  contact during active infections. Genital herpes is contagious. °· Practice safe sex. Latex condoms and female condoms may help to prevent the spread of the herpes virus. °· Avoid rubbing or touching the blisters and sores. If you do touch the blister or sores: °¨ Wash your hands thoroughly. °¨ Do not touch your eyes afterward. °· If you become pregnant, tell your health care provider if you have had genital herpes. °· Keep all follow-up visits as directed by your health care provider. This is important. °PREVENTION °· Use condoms. Although anyone can contract genital herpes during sexual contact even with the use of a condom, a condom can provide some protection. °· Avoid having multiple sexual partners. °· Talk to your sexual partner about any symptoms and past history that either of you may have. °· Get tested before you have sex. Ask your partner to do the same. °· Recognize the symptoms of genital herpes. Do not have sexual contact if you notice these symptoms. °SEEK MEDICAL CARE IF: °· Your symptoms are not improving with medicine. °· Your symptoms return. °· You have new symptoms. °· You have a fever. °· You have abdominal pain. °· You have redness, swelling, or pain in your eye. °MAKE SURE YOU: °· Understand these instructions. °· Will watch your condition. °· Will get help right away if you are not doing well or get worse. °This information is not intended to replace advice given to you by your health care provider. Make sure you discuss any questions you have with your health care provider. °Document Released: 05/15/2000 Document Revised: 06/08/2014 Document   Reviewed: 10/03/2013 °Elsevier Interactive Patient Education © 2017 [employer] °Vaginal Yeast infection, Adult °Vaginal yeast infection is a condition that causes soreness, swelling, and redness (inflammation) of the vagina. It also causes vaginal discharge. This is a common condition. Some women get this infection frequently. °What are the  causes? °This condition is caused by a change in the normal balance of the yeast (candida) and bacteria that live in the vagina. This change causes an overgrowth of yeast, which causes the inflammation. °What increases the risk? °This condition is more likely to develop in: °· Women who take antibiotic medicines. °· Women who have diabetes. °· Women who take birth control pills. °· Women who are pregnant. °· Women who douche often. °· Women who have a weak defense (immune) system. °· Women who have been taking steroid medicines for a long time. °· Women who frequently wear tight clothing. °What are the signs or symptoms? °Symptoms of this condition include: °· White, thick vaginal discharge. °· Swelling, itching, redness, and irritation of the vagina. The lips of the vagina (vulva) may be affected as well. °· Pain or a burning feeling while urinating. °· Pain during sex. °How is this diagnosed? °This condition is diagnosed with a medical history and physical exam. This will include a pelvic exam. Your health care provider will examine a sample of your vaginal discharge under a microscope. Your health care provider may send this sample for testing to confirm the diagnosis. °How is this treated? °This condition is treated with medicine. Medicines may be over-the-counter or prescription. You may be told to use one or more of the following: °· Medicine that is taken orally. °· Medicine that is applied as a cream. °· Medicine that is inserted directly into the vagina (suppository). °Follow these instructions at home: °· Take or apply over-the-counter and prescription medicines only as told by your health care provider. °· Do not have sex until your health care provider has approved. Tell your sex partner that you have a yeast infection. That person should go to his or her health care provider if he or she develops symptoms. °· Do not wear tight clothes, such as pantyhose or tight pants. °· Avoid using tampons until your  health care provider approves. °· Eat more yogurt. This may help to keep your yeast infection from returning. °· Try taking a sitz bath to help with discomfort. This is a warm water bath that is taken while you are sitting down. The water should only come up to your hips and should cover your buttocks. Do this 3-4 times per day or as told by your health care provider. °· Do not douche. °· Wear breathable, cotton underwear. °· If you have diabetes, keep your blood sugar levels under control. °Contact a health care provider if: °· You have a fever. °· Your symptoms go away and then return. °· Your symptoms do not get better with treatment. °· Your symptoms get worse. °· You have new symptoms. °· You develop blisters in or around your vagina. °· You have blood coming from your vagina and it is not your menstrual period. °· You develop pain in your abdomen. °This information is not intended to replace advice given to you by your health care provider. Make sure you discuss any questions you have with your health care provider. °Document Released: 02/25/2005 Document Revised: 10/30/2015 Document Reviewed: 11/19/2014 °Elsevier Interactive Patient Education © 2017 [employer] ° °

## 2016-07-15 NOTE — Progress Notes (Signed)
Subjective:     Kim Harvey is a 26 y.o. female here for a Gyn problem visit for vaginal pain, itching, and burning x 1 week. This is a new problem but she was seen in [facility] and [facility] since onset of symptoms. She reports onset of vaginal itching/burning 1 week ago after a [place] and use of a hot tub.  The vaginal irritation improved slightly 2-3 days after onset, then became worse and is gradually worsening to 10/10 pain now making it difficult to urinate.  She is currently having her period and this is making the pain worse. She has taken ibuprofen 800 mg and tried OTC yeast creams but the pain continues to worsen.  She had STD testing, including an HSV swab in her [facility]U/ED visits and all results were negative.  She has family hx of cervical cancer at young ages and is worried about this also.    She denies h/a, dizziness, n/v, or fever/chills.     Gynecologic History Patient's last menstrual period was 07/13/2016. Contraception: none Last Pap: 3-4 years ago. Results were: normal   Obstetric History OB History  Gravida Para Term Preterm AB Living  2 2 2  0 0 2  SAB TAB Ectopic Multiple Live Births  0 0 0 0 2    # Outcome Date GA Lbr Len/2nd Weight Sex Delivery Anes PTL Lv  2 Term 12/03/12 2028w1d 25:03 / 00:22 7 lb 6 oz (3.345 kg) M Vag-Spont EPI  LIV  1 Term 12/19/11 5279w5d 06:51 / 00:43 6 lb 3.8 oz (2.83 kg) M Vag-Spont EPI  LIV       The following portions of the patient's history were reviewed and updated as appropriate: allergies, current medications, past family history, past medical history, past social history, past surgical history and problem list.  Review of Systems Pertinent items noted in HPI and remainder of comprehensive ROS otherwise negative.    Objective:      VS reviewed, nursing note reviewed,  Constitutional: well developed, well nourished, no distress HEENT: normocephalic CV: normal rate Pulm/chest wall: normal effort Abdomen: soft Neuro: alert and  oriented x 3 Skin: warm, dry Psych: affect normal  Pelvic exam: On inspection, several flat linear excoriations noted bilaterally on inner labia minora/vaginal walls with one 0.25 cm white patch on right labia that is slightly raised.  All lesions are significantly tender to palpation.  Small amount dark red bleeding c/w menses noted.  Lab testing for BV/yeast/trichomonas repeated today.   Consult Dr Shawnie PonsPratt and Dr Shawnie PonsPratt to room to evaluate lesions. Likely HSV despite negative local swab.  Could also be yeast with scratching caused excoriations.  Will treat for yeast and HSV. Pt to f/u if symptoms continue.    Assessment:   1. Vaginal discharge  - Cervicovaginal ancillary only  2. Vaginal yeast infection  - fluconazole (DIFLUCAN) 150 MG tablet; Take 1 tablet (150 mg total) by mouth once.  Dispense: 1 tablet; Refill: 2  3. Primary genital herpes simplex infection  - acyclovir (ZOVIRAX) 200 MG capsule; Take 2 capsules (400 mg total) by mouth 3 (three) times daily.  Dispense: 42 capsule; Refill: 0 - acyclovir (ZOVIRAX) 200 MG capsule; Take 2 capsules (400 mg total) by mouth 3 (three) times daily.  Dispense: 30 capsule; Refill: 11 - lidocaine (XYLOCAINE) 2 % jelly; Apply 1 application topically as needed.  Dispense: 30 mL; Refill: 1 --Ibuprofen 800 mg Q 6 hours PRN Plan:      Teaching done about primary  HSV.  Questions answered. See above for medications. Rx for acyclovir for primary outbreak and recurrent outbreaks. F/U as soon as insurance starts for Pap or as needed for problem visits

## 2016-07-16 ENCOUNTER — Ambulatory Visit: Payer: Self-pay | Admitting: Obstetrics & Gynecology

## 2016-07-16 LAB — CERVICOVAGINAL ANCILLARY ONLY
BACTERIAL VAGINITIS: NEGATIVE
Candida vaginitis: NEGATIVE
TRICH (WINDOWPATH): NEGATIVE

## 2016-10-30 ENCOUNTER — Emergency Department (HOSPITAL_COMMUNITY): Admission: EM | Admit: 2016-10-30 | Discharge: 2016-10-30 | Discharge status: Against Medical Advice | Payer: Self-pay

## 2016-12-09 ENCOUNTER — Encounter (HOSPITAL_COMMUNITY): Payer: Self-pay | Admitting: Emergency Medicine

## 2016-12-09 ENCOUNTER — Emergency Department (HOSPITAL_COMMUNITY)
Admission: EM | Admit: 2016-12-09 | Discharge: 2016-12-09 | Disposition: A | Discharge status: Home / Self Care | Payer: Self-pay | Attending: Emergency Medicine | Admitting: Emergency Medicine

## 2016-12-09 DIAGNOSIS — Z791 Long term (current) use of non-steroidal anti-inflammatories (NSAID): Secondary | ICD-10-CM | POA: Insufficient documentation

## 2016-12-09 DIAGNOSIS — Z79899 Other long term (current) drug therapy: Secondary | ICD-10-CM | POA: Insufficient documentation

## 2016-12-09 DIAGNOSIS — F17210 Nicotine dependence, cigarettes, uncomplicated: Secondary | ICD-10-CM | POA: Insufficient documentation

## 2016-12-09 DIAGNOSIS — N3001 Acute cystitis with hematuria: Secondary | ICD-10-CM | POA: Insufficient documentation

## 2016-12-09 LAB — URINALYSIS, ROUTINE W REFLEX MICROSCOPIC
Bilirubin Urine: NEGATIVE
Glucose, UA: NEGATIVE mg/dL
Ketones, ur: NEGATIVE mg/dL
Nitrite: NEGATIVE
PH: 8 (ref 5.0–8.0)
Protein, ur: NEGATIVE mg/dL
RBC / HPF: NONE SEEN RBC/hpf (ref 0–5)
Specific Gravity, Urine: 1.008 (ref 1.005–1.030)

## 2016-12-09 MED ORDER — CEPHALEXIN 500 MG PO CAPS: 500.0000 mg | ORAL_CAPSULE | Freq: Two times a day (BID) | ORAL | 0 refills | Status: DC
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
## 2016-12-09 NOTE — ED Triage Notes (Signed)
States dysuria x 4-5 days some cramping she states

## 2016-12-09 NOTE — Discharge Instructions (Signed)
Please read attached information. If you experience any new or worsening signs or symptoms please return to the emergency room for evaluation. Please follow-up with your primary care provider or specialist as discussed. Please use medication prescribed only as directed and discontinue taking if you have any concerning signs or symptoms.   °

## 2017-03-01 ENCOUNTER — Ambulatory Visit: Payer: Self-pay | Admitting: Advanced Practice Midwife

## 2017-03-03 ENCOUNTER — Ambulatory Visit: Payer: Self-pay | Admitting: Advanced Practice Midwife

## 2017-07-26 ENCOUNTER — Other Ambulatory Visit: Payer: Self-pay

## 2017-07-30 LAB — CYTOLOGY - PAP: Diagnosis: NEGATIVE

## 2018-02-26 IMAGING — US US PELVIS COMPLETE
1 series · 15 of 25 positions shown · non-contrast
Comparison: 12/31/2014

CLINICAL DATA: Pelvic pain in a female for 3 months intermittently
; LMP 08/05/2015

EXAM:
TRANSABDOMINAL AND TRANSVAGINAL ULTRASOUND OF PELVIS
TECHNIQUE: Both transabdominal and transvaginal ultrasound examinations of the
pelvis were performed. Transabdominal technique was performed for
global imaging of the pelvis including uterus, ovaries, adnexal
regions, and pelvic cul-de-sac. It was necessary to proceed with
endovaginal exam following the transabdominal exam to better
visualize the endometrium.

[Series 1: us pelvis complete · 15 of 44 slices shown]
[im 1/44]
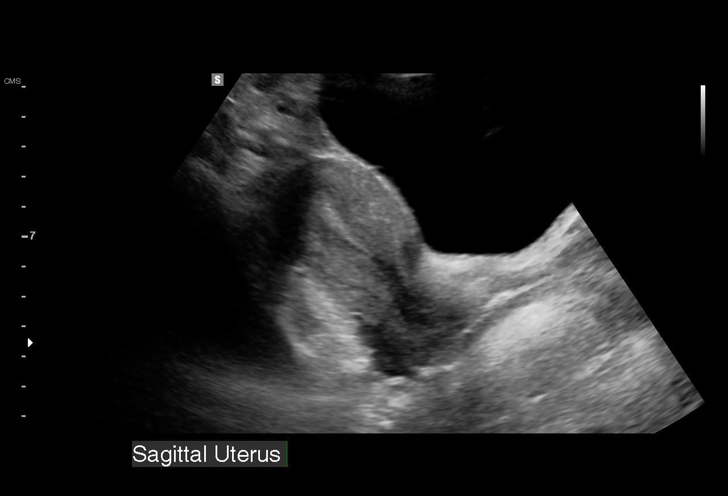
[im 4/44]
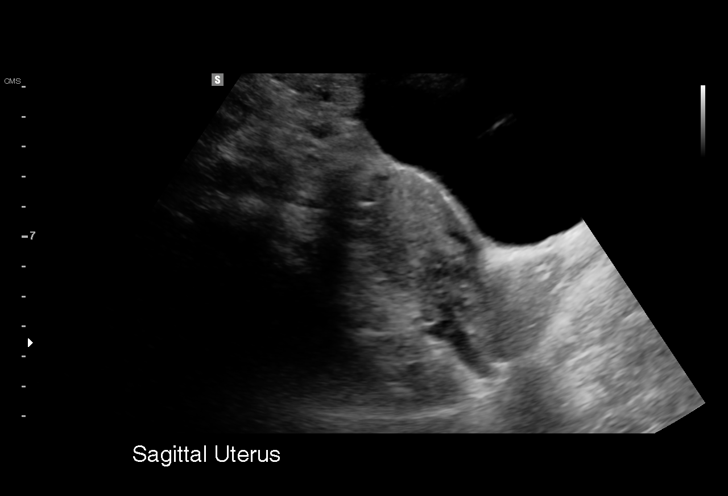
[im 8/44]
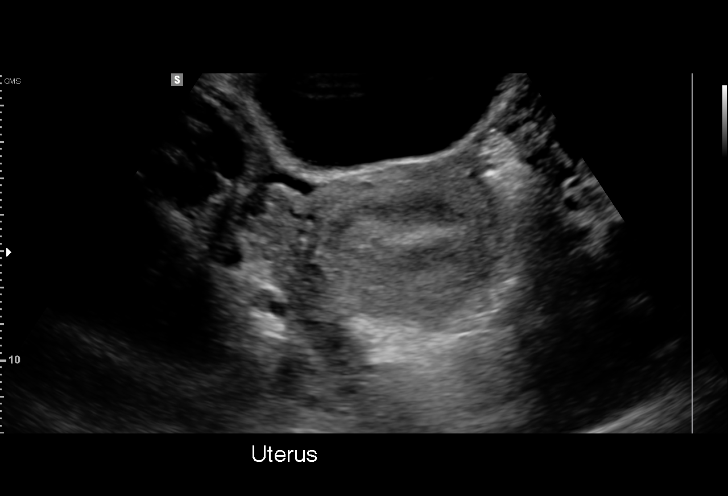
[im 9/44]
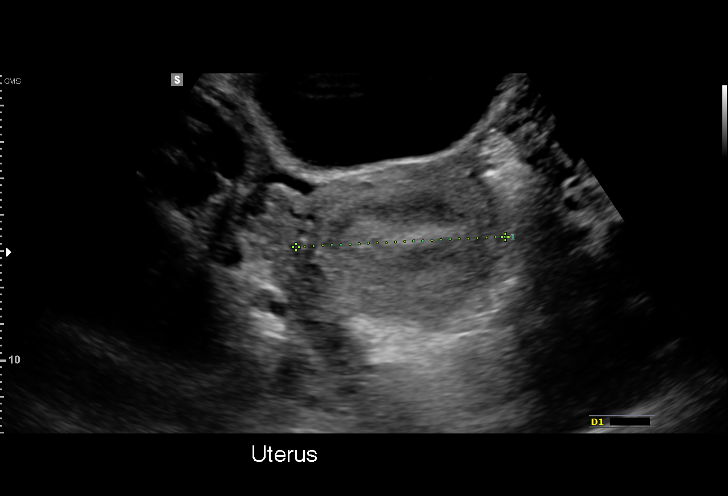
[im 13/44]
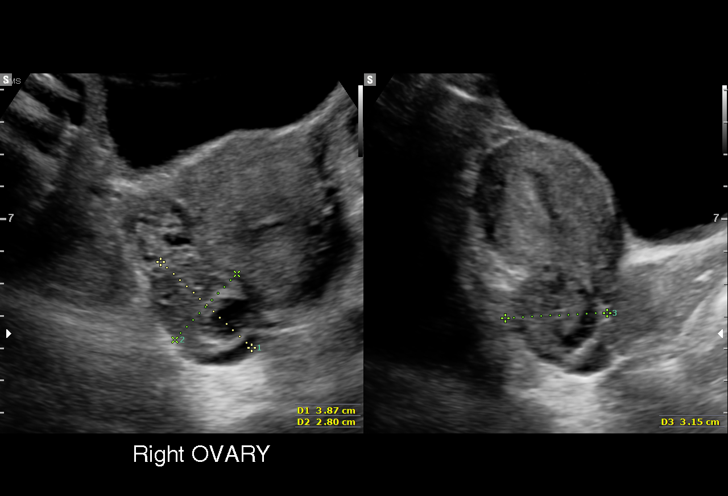
[im 17/44]
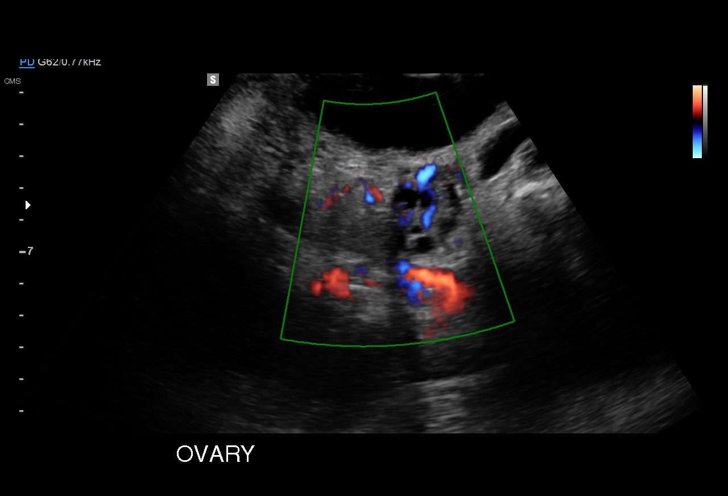
[im 18/44]
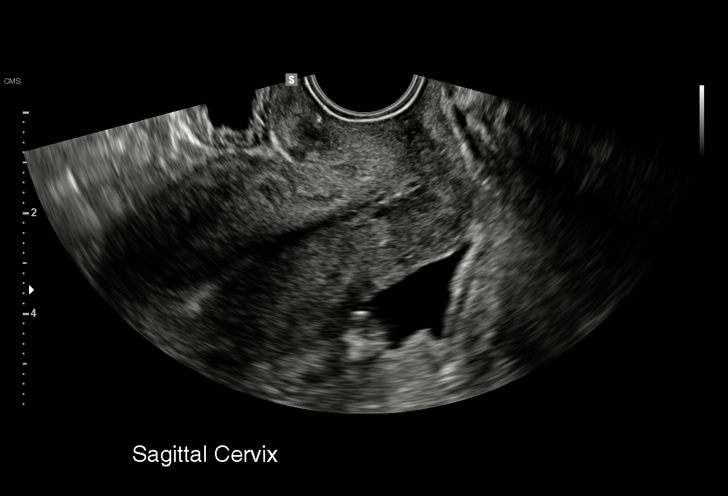
[im 22/44]
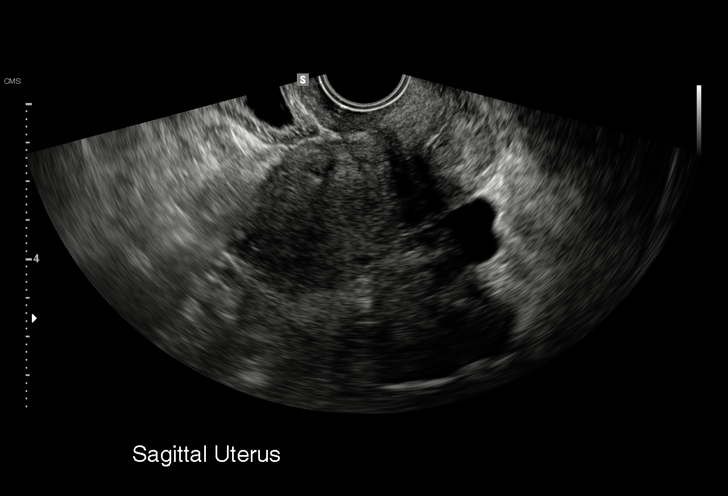
[im 26/44]
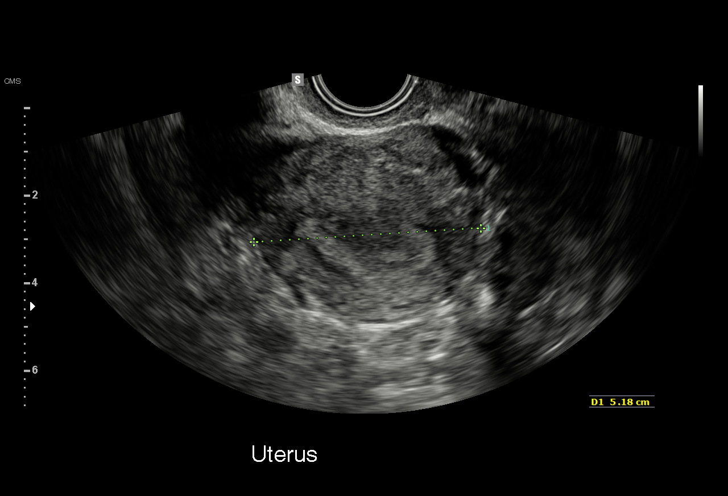
[im 27/44]
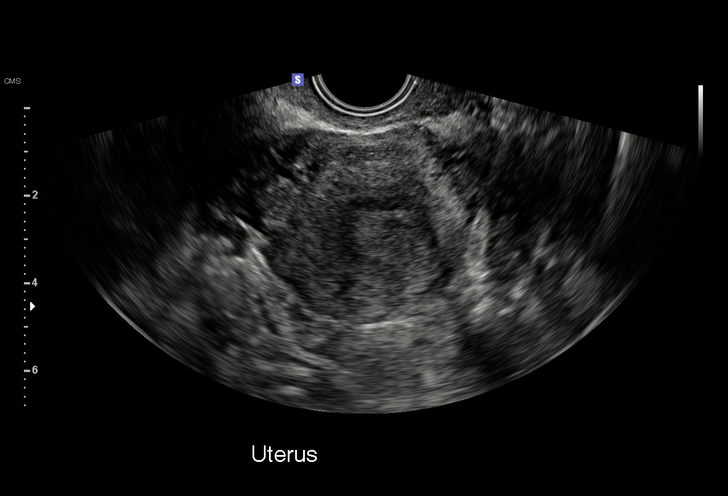
[im 31/44]
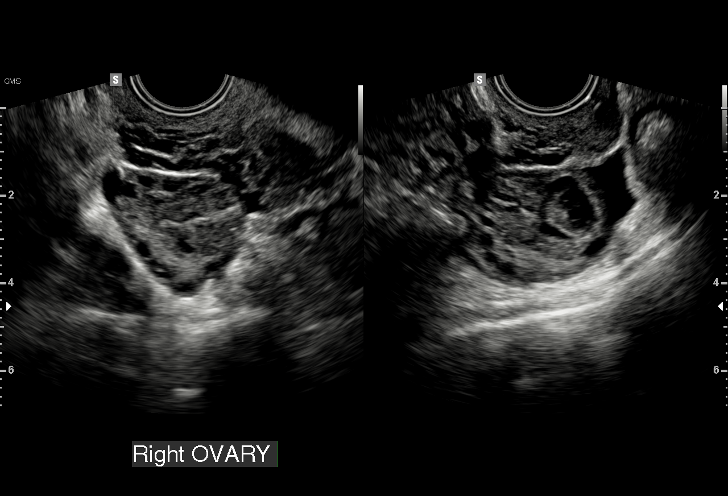
[im 35/44]
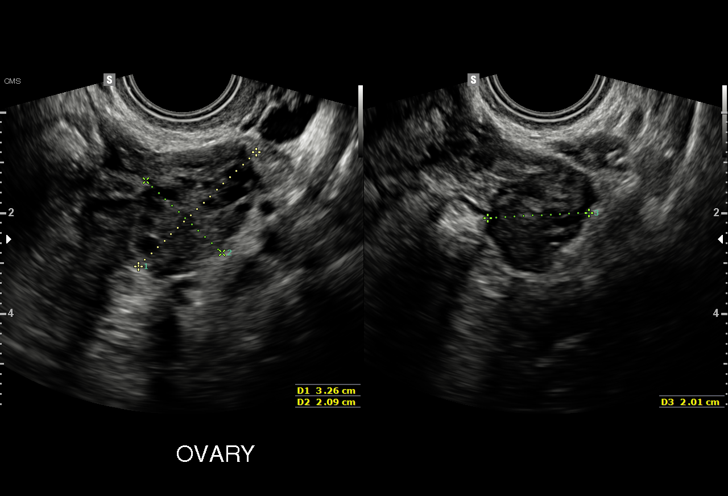
[im 36/44]
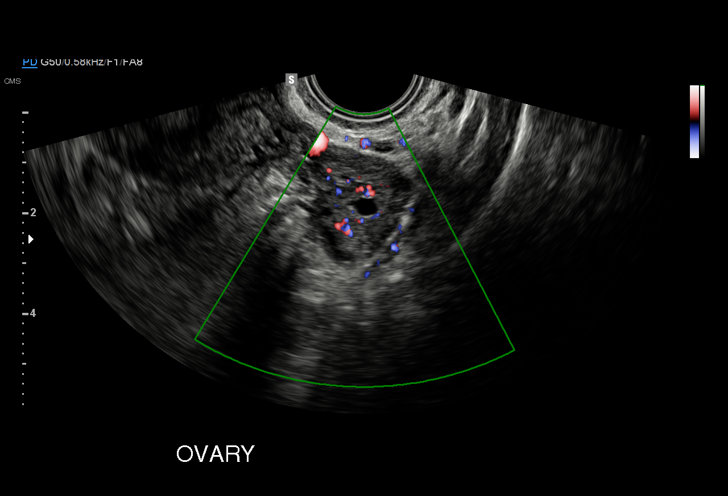
[im 40/44]
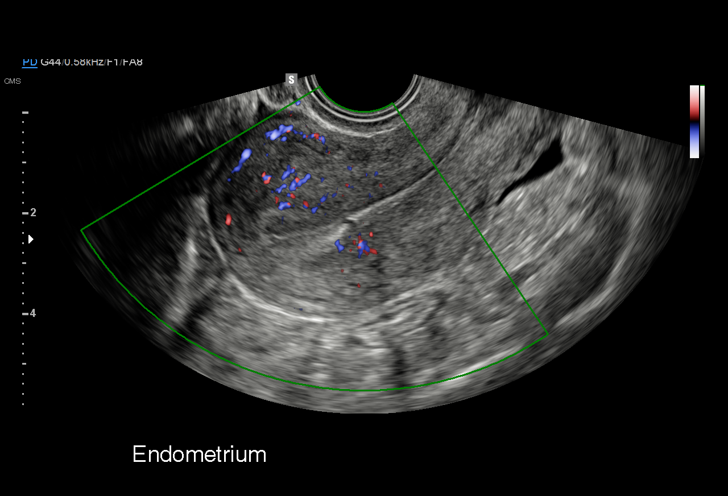
[im 44/44]
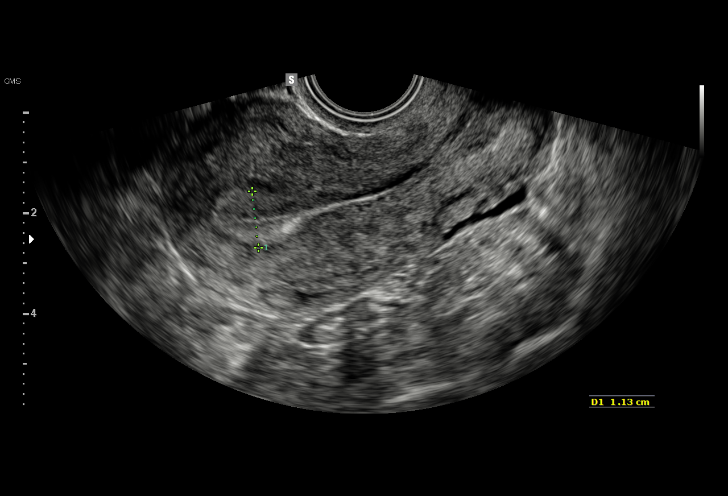

[15 of 25 positions shown; findings below may reference images not displayed]

FINDINGS: Uterus

Measurements: 7.7 x 4.3 x 5.2 cm. Normal morphology without mass

Endometrium

Thickness: 11 mm thick.  No endometrial fluid or focal abnormalities

Right ovary

Measurements: 3.5 x 3.2 x 2.7 cm. Normal morphology without mass.
Internal blood flow present on color Doppler imaging.

Left ovary

Measurements: 3.3 x 2.1 x 2.0 cm. Normal morphology without mass.
Internal blood flow present on color Doppler imaging

Other findings

Trace free pelvic fluid.  No adnexal masses.
IMPRESSION: Unremarkable pelvic sonography.

## 2018-10-22 ENCOUNTER — Encounter (HOSPITAL_COMMUNITY): Payer: Self-pay

## 2018-10-22 ENCOUNTER — Emergency Department (HOSPITAL_COMMUNITY): Payer: Self-pay

## 2018-10-22 ENCOUNTER — Emergency Department (HOSPITAL_COMMUNITY)
Admission: EM | Admit: 2018-10-22 | Discharge: 2018-10-23 | Disposition: A | Discharge status: Home / Self Care | Payer: Self-pay | Attending: Emergency Medicine | Admitting: Emergency Medicine

## 2018-10-22 DIAGNOSIS — M545 Low back pain, unspecified: Secondary | ICD-10-CM

## 2018-10-22 DIAGNOSIS — F17210 Nicotine dependence, cigarettes, uncomplicated: Secondary | ICD-10-CM | POA: Insufficient documentation

## 2018-10-22 DIAGNOSIS — Y92410 Unspecified street and highway as the place of occurrence of the external cause: Secondary | ICD-10-CM | POA: Insufficient documentation

## 2018-10-22 DIAGNOSIS — Y998 Other external cause status: Secondary | ICD-10-CM | POA: Insufficient documentation

## 2018-10-22 DIAGNOSIS — W2210XA Striking against or struck by unspecified automobile airbag, initial encounter: Secondary | ICD-10-CM | POA: Insufficient documentation

## 2018-10-22 DIAGNOSIS — M79601 Pain in right arm: Secondary | ICD-10-CM

## 2018-10-22 DIAGNOSIS — Y9389 Activity, other specified: Secondary | ICD-10-CM | POA: Insufficient documentation

## 2018-10-22 LAB — I-STAT BETA HCG BLOOD, ED (MC, WL, AP ONLY): I-stat hCG, quantitative: <5 m[IU]/mL (ref <5)

## 2018-10-22 MED ORDER — ACETAMINOPHEN 325 MG PO TABS
650.0000 mg | ORAL_TABLET | Freq: Once | ORAL | Status: AC
  Administered 2018-10-22: 22:00:00 650 mg via ORAL
  Filled 2018-10-22: qty 2
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
## 2018-10-22 NOTE — ED Notes (Signed)
Patient transported to X-ray 

## 2018-10-22 NOTE — ED Notes (Signed)
Ice placed on pts arm.

## 2018-10-22 NOTE — ED Notes (Signed)
Pt walked outside with family. I went and advised pt not lto leave the waiting room due to IV that pt had placed by EMS

## 2018-10-23 MED ORDER — METHOCARBAMOL 500 MG PO TABS: 500.0000 mg | ORAL_TABLET | Freq: Two times a day (BID) | ORAL | 0 refills | Status: DC
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
## 2019-03-04 ENCOUNTER — Encounter (HOSPITAL_COMMUNITY): Payer: Self-pay

## 2019-03-04 ENCOUNTER — Emergency Department (HOSPITAL_COMMUNITY)
Admission: EM | Admit: 2019-03-04 | Discharge: 2019-03-04 | Disposition: A | Discharge status: Home / Self Care | Payer: Self-pay | Attending: Emergency Medicine | Admitting: Emergency Medicine

## 2019-03-04 ENCOUNTER — Other Ambulatory Visit: Payer: Self-pay

## 2019-03-04 ENCOUNTER — Emergency Department (HOSPITAL_COMMUNITY): Payer: Self-pay

## 2019-03-04 DIAGNOSIS — Z79899 Other long term (current) drug therapy: Secondary | ICD-10-CM | POA: Insufficient documentation

## 2019-03-04 DIAGNOSIS — Y999 Unspecified external cause status: Secondary | ICD-10-CM | POA: Insufficient documentation

## 2019-03-04 DIAGNOSIS — F17210 Nicotine dependence, cigarettes, uncomplicated: Secondary | ICD-10-CM | POA: Insufficient documentation

## 2019-03-04 DIAGNOSIS — Y929 Unspecified place or not applicable: Secondary | ICD-10-CM | POA: Insufficient documentation

## 2019-03-04 DIAGNOSIS — W458XXA Other foreign body or object entering through skin, initial encounter: Secondary | ICD-10-CM | POA: Insufficient documentation

## 2019-03-04 DIAGNOSIS — S90851A Superficial foreign body, right foot, initial encounter: Secondary | ICD-10-CM | POA: Insufficient documentation

## 2019-03-04 DIAGNOSIS — Y939 Activity, unspecified: Secondary | ICD-10-CM | POA: Insufficient documentation

## 2019-03-04 MED ORDER — CIPROFLOXACIN HCL 750 MG PO TABS: 750.0000 mg | ORAL_TABLET | Freq: Two times a day (BID) | ORAL | 0 refills | Status: AC

## 2019-03-04 MED ORDER — LIDOCAINE HCL 2 % IJ SOLN
10.0000 mL | Freq: Once | INTRAMUSCULAR | Status: DC
  Filled 2019-03-04: qty 20

## 2019-03-04 NOTE — ED Triage Notes (Signed)
Pt reports foot pain in the middle of the sole of her R foot. Small, discolored spot noted in area. States that pain has worsened over the last 5 days. Denies walking barefoot so she doesn't think anything has punctured the area.

## 2019-03-04 NOTE — Discharge Instructions (Signed)
Thank you for allowing me to care for you today in the Emergency Department.   The foreign body appeared to be a sharp piece of plastic and was removed in its entirety.  It is important to keep the area where the incision was made clean and dry.  Wash the area at least once daily with warm water and soap.  You can apply topical antibiotic such as bacitracin or Neosporin.  Apply a piece of gauze or bandage to the area.  Make sure that you are wearing socks because the soles of your shoes can harbor a lot of bacteria.  Take 1 tablet of ciprofloxacin 2 times daily for the next 5 days.  Take Tylenol and ibuprofen for pain control.  Follow-up with Dr. Amalia Hailey with podiatry if your pain significantly worsens, if the area of swelling returns.  You should have the area rechecked if you develop fevers, chills, thick, mucus-like drainage from the foot, if he gets red, hot to the touch, if you develop other new, concerning symptoms.

## 2019-03-04 NOTE — ED Provider Notes (Signed)
[facility] DEPT Provider Note   CSN: [id number] Arrival date & time: 03/04/19  0006     History   Chief Complaint Chief Complaint  Patient presents with  . Foot Pain    R    HPI Kim Harvey is a 28 y.o. female with no pertinent past medical history who presents to the emergency department with a chief complaint of right foot pain.  The patient endorses pain to the sole of the right foot for the last 5 days.  Pain has been worsening since onset.  She feels as if she cannot bear weight on the sole of her foot and has to walk on the side of her foot due to the pain.  She has noticed a discolored area where the pain is maximal on the sole of the foot, but she does not recall any recent injury, fall, or puncturing the bottom of the foot.  She denies numbness, weakness, fever, chills, ankle pain, redness, swelling, or warmth to the area.  Tdap is up-to-date.     The history is provided by the patient. No language interpreter was used.    Past Medical History:  Diagnosis Date  . H/O chlamydia infection   . Headache(784.0)   . Heart murmur   . Trichimoniasis     Patient Active Problem List   Diagnosis Date Noted  . 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 ROOT CANAL    . WISDOM TOOTH EXTRACTION       OB History    Gravida  2   Para  2   Term  2   Preterm  0   AB  0   Living  2     SAB  0   TAB  0   Ectopic  0   Multiple  0   Live Births  2            Home Medications    Prior to Admission medications   Medication Sig Start Date End Date Taking? Authorizing Provider  cetirizine-pseudoephedrine (ZYRTEC-D) 5-120 MG tablet Take 1 tablet by mouth 2 (two) times daily.   Yes [provider]  sodium chloride (OCEAN) 0.65 % SOLN nasal spray Place 1 spray into both nostrils as needed for congestion.   Yes [provider]   ciprofloxacin (CIPRO) 750 MG tablet Take 1 tablet (750 mg total) by mouth 2 (two) times daily for 5 days. 03/04/19 03/09/19  Detta Mellin A, PA-C  EPINEPHrine 0.3 mg/0.3 mL IJ SOAJ injection Inject 0.3 mLs (0.3 mg total) into the muscle once. 11/05/15   Shary Decamp, PA-C    Family History Family History  Problem Relation Age of Onset  . Asthma Mother   . Hypertension Mother   . Asthma Father   . Heart disease Father   . Hypertension Father   . Hyperlipidemia Father   . Vision loss Father   . Anesthesia problems Neg Hx   . Hypotension Neg Hx   . Malignant hyperthermia Neg Hx   . Pseudochol deficiency Neg Hx   . Other Neg Hx     Social History Social History   Tobacco Use  . Smoking status: Current Every Day Smoker    Packs/day: 0.50    Years: 6.00    Pack years: 3.00    Types: Cigarettes  . Smokeless tobacco: Never Used  Substance Use Topics  . Alcohol use: No  .  Drug use: No    Types: Marijuana    Comment: last use 5 months ago     Allergies   Patient has no known allergies.   Review of Systems Review of Systems  Constitutional: Negative for activity change, chills and fever.  Respiratory: Negative for shortness of breath.   Cardiovascular: Negative for chest pain.  Gastrointestinal: Negative for abdominal pain, diarrhea and vomiting.  Genitourinary: Negative for dysuria.  Musculoskeletal: Positive for arthralgias, gait problem and myalgias. Negative for back pain, joint swelling, neck pain and neck stiffness.  Skin: Negative for rash.  Allergic/Immunologic: Negative for immunocompromised state.  Neurological: Negative for syncope, speech difficulty, weakness and headaches.  Psychiatric/Behavioral: Negative for confusion.    Physical Exam Updated Vital Signs BP 131/75 (BP Location: Right Arm)   Pulse 83   Temp 98.7 F (37.1 C) (Oral)   Resp 16   SpO2 99%   Physical Exam Vitals signs and nursing note reviewed.  Constitutional:      General: She is not  in acute distress. HENT:     Head: Normocephalic.  Eyes:     Conjunctiva/sclera: Conjunctivae normal.  Neck:     Musculoskeletal: Neck supple.  Cardiovascular:     Rate and Rhythm: Normal rate and regular rhythm.     Heart sounds: No murmur. No friction rub. No gallop.   Pulmonary:     Effort: Pulmonary effort is normal. No respiratory distress.  Abdominal:     General: There is no distension.     Palpations: Abdomen is soft.  Musculoskeletal:     Comments: There is a callused, tender, discolored area noted to the sole of the right midfoot.  No surrounding warmth, erythema, or edema.  No purulent drainage.  No obvious foreign body.  Patient is able to bear weight on the bilateral lower extremities.  She is neurovascularly intact.  Skin:    General: Skin is warm.     Findings: No rash.  Neurological:     Mental Status: She is alert.  Psychiatric:        Behavior: Behavior normal.      ED Treatments / Results  Labs (all labs ordered are listed, but only abnormal results are displayed) Labs Reviewed - No data to display  EKG None  Radiology Dg Foot Complete Right  Result Date: 03/04/2019 CLINICAL DATA:  Foot pain EXAM: RIGHT FOOT COMPLETE - 3+ VIEW COMPARISON:  None. FINDINGS: There is no evidence of fracture or dislocation. There is no evidence of arthropathy or other focal bone abnormality. Soft tissues are unremarkable. IMPRESSION: Negative. Electronically Signed   By: Jonna Clark M.D.   On: 03/04/2019 01:55    Procedures .Foreign Body Removal  Date/Time: 03/04/2019 8:48 AM Performed by: Barkley Boards, PA-C Authorized by: Barkley Boards, PA-C  Consent: Verbal consent obtained. Consent given by: patient Patient understanding: patient does not state understanding of the procedure being performed Patient consent: the patient's understanding of the procedure does not match consent given Imaging studies: imaging studies available Patient identity confirmed: verbally  with patient Intake: foot. Anesthesia: local infiltration  Anesthesia: Local Anesthetic: lidocaine 2% without epinephrine Anesthetic total: 5 mL Patient cooperative: yes Complexity: simple 1 objects recovered. Objects recovered: sharp, angulated piece of thick palstic  Post-procedure assessment: foreign body removed Patient tolerance: patient tolerated the procedure well with no immediate complications   (including critical care time)  Medications Ordered in ED Medications - No data to display   Initial Impression / Assessment and  Plan / ED Course  I have reviewed the triage vital signs and the nursing notes.  Pertinent labs & imaging results that were available during my care of the patient were reviewed by me and considered in my medical decision making (see chart for details).        28 year old female presenting with pain to the sole of the right foot for the last 5 days.  She has had difficulty weightbearing on the sole of the foot due to the pain.  On exam, there is a small callused area where her pain is localized.  X-ray of the foot was reviewed and is unremarkable.  Shared decision-making conversation, patient was agreeable to allowing the callus to be pared down and to further assess to ensure there was no foreign body.  After minimal paring down of the callus, an irregularly shaped, jagged foreign body was noted in the epidermis.  A small incision, no deeper than 1 to 2 mm was made after the patient was anesthetized and a small, jagged piece of plastic was removed as well.  The area drained 1 to 2 cc of clear fluid.  Given the location of the incision made to remove foreign body, will cover the patient with ciprofloxacin for Pseudomonas.  She was encouraged to keep the wound covered until the area heals.  She will be given a referral for podiatry if she requires follow-up.  All questions answered.  She is hemodynamically stable in no acute distress.  Safe for discharge home  with outpatient follow-up.  Final Clinical Impressions(s) / ED Diagnoses   Final diagnoses:  Foreign body in right foot, initial encounter    ED Discharge Orders         Ordered    ciprofloxacin (CIPRO) 750 MG tablet  2 times daily     03/04/19 0535           Haziel Molner, Pedro EarlsMia A, PA-C 03/04/19 [id number]    Derwood KaplanNanavati, Ankit, MD 03/04/19 2254

## 2019-07-06 ENCOUNTER — Other Ambulatory Visit: Payer: Self-pay

## 2019-07-06 ENCOUNTER — Emergency Department (HOSPITAL_COMMUNITY)
Admission: EM | Admit: 2019-07-06 | Discharge: 2019-07-06 | Disposition: A | Discharge status: Home / Self Care | Payer: Self-pay | Attending: Emergency Medicine | Admitting: Emergency Medicine

## 2019-07-06 ENCOUNTER — Encounter (HOSPITAL_COMMUNITY): Payer: Self-pay | Admitting: Emergency Medicine

## 2019-07-06 DIAGNOSIS — N3001 Acute cystitis with hematuria: Secondary | ICD-10-CM | POA: Insufficient documentation

## 2019-07-06 DIAGNOSIS — Z79899 Other long term (current) drug therapy: Secondary | ICD-10-CM | POA: Insufficient documentation

## 2019-07-06 DIAGNOSIS — F17210 Nicotine dependence, cigarettes, uncomplicated: Secondary | ICD-10-CM | POA: Insufficient documentation

## 2019-07-06 DIAGNOSIS — R1084 Generalized abdominal pain: Secondary | ICD-10-CM | POA: Insufficient documentation

## 2019-07-06 LAB — URINALYSIS, ROUTINE W REFLEX MICROSCOPIC
Bilirubin Urine: NEGATIVE
Glucose, UA: NEGATIVE mg/dL
Ketones, ur: NEGATIVE mg/dL
Nitrite: POSITIVE — AB
Protein, ur: 30 mg/dL — AB
Specific Gravity, Urine: 1.011 (ref 1.005–1.030)
pH: 7 (ref 5.0–8.0)

## 2019-07-06 LAB — COMPREHENSIVE METABOLIC PANEL
ALT: 14 U/L (ref 0–44)
AST: 19 U/L (ref 15–41)
Albumin: 4.5 g/dL (ref 3.5–5.0)
Alkaline Phosphatase: 40 U/L (ref 38–126)
Anion gap: 10 (ref 5–15)
BUN: 8 mg/dL (ref 6–20)
CO2: 24 mmol/L (ref 22–32)
Calcium: 9.7 mg/dL (ref 8.9–10.3)
Chloride: 105 mmol/L (ref 98–111)
Creatinine, Ser: 0.56 mg/dL (ref 0.44–1.00)
GFR calc Af Amer: >60 mL/min (ref >60)
GFR calc non Af Amer: >60 mL/min (ref >60)
Glucose, Bld: 82 mg/dL (ref 70–99)
Potassium: 3.6 mmol/L (ref 3.5–5.1)
Sodium: 139 mmol/L (ref 135–145)
Total Bilirubin: 0.8 mg/dL (ref 0.3–1.2)
Total Protein: 7.9 g/dL (ref 6.5–8.1)

## 2019-07-06 LAB — CBC
HCT: 40.7 % (ref 36.0–46.0)
Hemoglobin: 14 g/dL (ref 12.0–15.0)
MCH: 33.7 pg (ref 26.0–34.0)
MCHC: 34.4 g/dL (ref 30.0–36.0)
MCV: 98.1 fL (ref 80.0–100.0)
Platelets: 205 10*3/uL (ref 150–400)
RBC: 4.15 MIL/uL (ref 3.87–5.11)
RDW: 12 % (ref 11.5–15.5)
WBC: 10.8 10*3/uL — ABNORMAL HIGH (ref 4.0–10.5)
nRBC: 0 % (ref 0.0–0.2)

## 2019-07-06 LAB — LIPASE, BLOOD: Lipase: 79 U/L — ABNORMAL HIGH (ref 11–51)

## 2019-07-06 LAB — PREGNANCY, URINE: Preg Test, Ur: NEGATIVE

## 2019-07-06 MED ORDER — CEPHALEXIN 250 MG PO CAPS: 250.0000 mg | ORAL_CAPSULE | Freq: Four times a day (QID) | ORAL | 0 refills | Status: DC

## 2019-07-06 MED ORDER — CEPHALEXIN 500 MG PO CAPS
500.0000 mg | ORAL_CAPSULE | Freq: Once | ORAL | Status: AC
  Administered 2019-07-06: 22:00:00 500 mg via ORAL
  Filled 2019-07-06: qty 1

## 2019-07-06 MED ORDER — IBUPROFEN 800 MG PO TABS
800.0000 mg | ORAL_TABLET | Freq: Once | ORAL | Status: AC
  Administered 2019-07-06: 22:00:00 800 mg via ORAL
  Filled 2019-07-06: qty 1

## 2019-07-06 MED ORDER — PHENAZOPYRIDINE HCL 200 MG PO TABS: 200.0000 mg | ORAL_TABLET | Freq: Three times a day (TID) | ORAL | 0 refills | Status: DC

## 2019-07-06 NOTE — ED Notes (Signed)
Pt ambulatory from Triage with a steady gait.  

## 2019-07-06 NOTE — ED Triage Notes (Signed)
Pt c/o odor with urine and some abd cramping for 2 weeks.

## 2019-07-06 NOTE — Discharge Instructions (Addendum)
Follow-up with a primary care doctor in a week or so to make sure infection is cleared.  Return to the ED for troubles with fever or vomiting

## 2019-07-06 NOTE — ED Provider Notes (Signed)
[facility]-EMERGENCY DEPT Provider Note   CSN: [id number] Arrival date & time: 07/06/19  1651     History Chief Complaint  Patient presents with  . Dysuria  . Abdominal Cramping    Kim Harvey is a 29 y.o. female.  HPI   Patient presented to the emergency room for evaluation of urinary discomfort.  Patient states the symptoms started about a week ago.  She noticed an odor to the urine.  She has had some discomfort as well as lower abdominal cramping.  She denies any fevers or chills but in the last couple days to start having pain moving towards her upper abdomen.  Past Medical History:  Diagnosis Date  . H/O chlamydia infection   . Headache(784.0)   . Heart murmur   . Trichimoniasis     Patient Active Problem List   Diagnosis Date Noted  . 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 ROOT CANAL    . WISDOM TOOTH EXTRACTION       OB History    Gravida  2   Para  2   Term  2   Preterm  0   AB  0   Living  2     SAB  0   TAB  0   Ectopic  0   Multiple  0   Live Births  2           Family History  Problem Relation Age of Onset  . Asthma Mother   . Hypertension Mother   . Asthma Father   . Heart disease Father   . Hypertension Father   . Hyperlipidemia Father   . Vision loss Father   . Anesthesia problems Neg Hx   . Hypotension Neg Hx   . Malignant hyperthermia Neg Hx   . Pseudochol deficiency Neg Hx   . Other Neg Hx     Social History   Tobacco Use  . Smoking status: Current Every Day Smoker    Packs/day: 0.50    Years: 6.00    Pack years: 3.00    Types: Cigarettes  . Smokeless tobacco: Never Used  Substance Use Topics  . Alcohol use: No  . Drug use: No    Types: Marijuana    Comment: last use 5 months ago    Home Medications Prior to Admission medications   Medication Sig Start Date End Date Taking? Authorizing Provider    ibuprofen (ADVIL) 200 MG tablet Take 800 mg by mouth every 6 (six) hours as needed for headache or moderate pain.   Yes [provider]  cephALEXin (KEFLEX) 250 MG capsule Take 1 capsule (250 mg total) by mouth 4 (four) times daily. 07/06/19   Linwood Dibbles, MD  EPINEPHrine 0.3 mg/0.3 mL IJ SOAJ injection Inject 0.3 mLs (0.3 mg total) into the muscle once. Patient not taking: Reported on 07/06/2019 11/05/15   Audry Pili, PA-C  phenazopyridine (PYRIDIUM) 200 MG tablet Take 1 tablet (200 mg total) by mouth 3 (three) times daily. 07/06/19   Linwood Dibbles, MD    Allergies    Patient has no known allergies.  Review of Systems   Review of Systems  All other systems reviewed and are negative.   Physical Exam Updated Vital Signs BP 120/76   Pulse 80   Temp 98.8 F (37.1 C) (Oral)   Resp 16   LMP 06/25/2019   SpO2 99%  Physical Exam Vitals and nursing note reviewed.  Constitutional:      General: She is not in acute distress.    Appearance: She is well-developed.  HENT:     Head: Normocephalic and atraumatic.     Right Ear: External ear normal.     Left Ear: External ear normal.  Eyes:     General: No scleral icterus.       Right eye: No discharge.        Left eye: No discharge.     Conjunctiva/sclera: Conjunctivae normal.  Neck:     Trachea: No tracheal deviation.  Cardiovascular:     Rate and Rhythm: Normal rate and regular rhythm.  Pulmonary:     Effort: Pulmonary effort is normal. No respiratory distress.     Breath sounds: Normal breath sounds. No stridor. No wheezing or rales.  Abdominal:     General: Bowel sounds are normal. There is no distension.     Palpations: Abdomen is soft.     Tenderness: There is no abdominal tenderness. There is no guarding or rebound.  Musculoskeletal:        General: No tenderness.     Cervical back: Neck supple.  Skin:    General: Skin is warm and dry.     Findings: No rash.  Neurological:     Mental Status: She is alert.      Cranial Nerves: No cranial nerve deficit (no facial droop, extraocular movements intact, no slurred speech).     Sensory: No sensory deficit.     Motor: No abnormal muscle tone or seizure activity.     Coordination: Coordination normal.     ED Results / Procedures / Treatments   Labs (all labs ordered are listed, but only abnormal results are displayed) Labs Reviewed  LIPASE, BLOOD - Abnormal; Notable for the following components:      Result Value   Lipase 79 (*)    All other components within normal limits  CBC - Abnormal; Notable for the following components:   WBC 10.8 (*)    All other components within normal limits  URINALYSIS, ROUTINE W REFLEX MICROSCOPIC - Abnormal; Notable for the following components:   APPearance HAZY (*)    Hgb urine dipstick MODERATE (*)    Protein, ur 30 (*)    Nitrite POSITIVE (*)    Leukocytes,Ua MODERATE (*)    Bacteria, UA MANY (*)    All other components within normal limits  URINE CULTURE  COMPREHENSIVE METABOLIC PANEL  PREGNANCY, URINE    EKG None  Radiology No results found.  Procedures Procedures (including critical care time)  Medications Ordered in ED Medications  ibuprofen (ADVIL) tablet 800 mg (800 mg Oral Given 07/06/19 2130)  cephALEXin (KEFLEX) capsule 500 mg (500 mg Oral Given 07/06/19 2221)    ED Course  I have reviewed the triage vital signs and the nursing notes.  Pertinent labs & imaging results that were available during my care of the patient were reviewed by me and considered in my medical decision making (see chart for details).  Clinical Course as of Jul 06 2227  Thu Jul 06, 2019  2137 Urinalysis consistent with urinary tract infection   [JK]    Clinical Course User Index [JK] Dorie Rank, MD   MDM Rules/Calculators/A&P                       Lab test show a slight elevation white blood cell count.  Lipase  is elevated but I do not think this is clinically significant.  Urinalysis is consistent with a  urinary tract infection.  No signs of pyelonephritis but the patient is having some abdominal discomfort moving up towards her flank.  I will treat her with a 1 week course of antibiotics.  Ffinal Clinical Impression(s) / ED Diagnoses Final diagnoses:  Acute cystitis with hematuria    Rx / DC Orders ED Discharge Orders         Ordered    cephALEXin (KEFLEX) 250 MG capsule  4 times daily     07/06/19 2227    phenazopyridine (PYRIDIUM) 200 MG tablet  3 times daily     07/06/19 2227           Linwood Dibbles, MD 07/06/19 2230

## 2019-07-08 LAB — URINE CULTURE: Culture: >=100000 — AB

## 2019-07-10 ENCOUNTER — Telehealth: Payer: Self-pay | Admitting: Emergency Medicine

## 2019-07-10 NOTE — Telephone Encounter (Signed)
Post ED Visit - Positive Culture Follow-up  Culture report reviewed by antimicrobial stewardship pharmacist: [facility] []  , Pharm.D. []  Enzo Bi, Pharm.D., BCPS AQ-ID []  , Pharm.D., BCPS []  Celedonio Miyamoto, .D., BCPS []  [place], .D., BCPS, AAHIVP []  Georgina Pillion, Pharm.D., BCPS, AAHIVP []  [address], PharmD, BCPS []  , PharmD, BCPS []  [place], PharmD, BCPS []  [place], PharmD []  , PharmD, BCPS []  Estella Husk, PharmD  Pharmacy Team []  Lysle Pearl, PharmD []  , PharmD []  Phillips Climes, PharmD []  , Rph []  Agapito Games) , PharmD []  Verlan Friends, PharmD []  , PharmD [x]  Mervyn Gay, PharmD []  , PharmD []  Vinnie Level, PharmD []  [facility], PharmD []  , PharmD []  Len Childs, PharmD   Positive urine culture Treated with cephalexin, organism sensitive to the same and no further patient follow-up is required at this time.  07/10/2019, 9:31 AM

## 2019-07-10 NOTE — Telephone Encounter (Signed)
Post ED Visit - Positive Culture Follow-up  Culture report reviewed by antimicrobial stewardship pharmacist: [facility] []  , Pharm.D. []  Enzo Bi, Pharm.D., BCPS AQ-ID []  , Pharm.D., BCPS []  Celedonio Miyamoto, Pharm.D., BCPS []  [place], Garvin Fila.D., BCPS, AAHIVP []  , Pharm.D., BCPS, AAHIVP []  Georgina Pillion, PharmD, BCPS []  , PharmD, BCPS []  [place], PharmD, BCPS []  [address], PharmD []  , PharmD, BCPS []  Estella Husk, PharmD  Pharmacy Team []  Lysle Pearl, PharmD []  , PharmD []  Phillips Climes, PharmD []  , Rph []  Agapito Games) , PharmD []  Verlan Friends, PharmD []  , PharmD [x]  Mervyn Gay, PharmD []  , PharmD []  Vinnie Level, PharmD []  [facility], PharmD []  , PharmD []  Len Childs, PharmD   Positive urine culture Treated with cephalexin, organism sensitive to the same and no further patient follow-up is required at this time.  07/10/2019, 9:26 AM

## 2019-10-03 ENCOUNTER — Ambulatory Visit: Payer: Self-pay | Admitting: Student

## 2019-11-24 ENCOUNTER — Encounter (HOSPITAL_COMMUNITY): Payer: Self-pay

## 2019-11-24 ENCOUNTER — Other Ambulatory Visit: Payer: Self-pay

## 2019-11-24 ENCOUNTER — Emergency Department (HOSPITAL_COMMUNITY): Payer: Self-pay

## 2019-11-24 ENCOUNTER — Emergency Department (HOSPITAL_COMMUNITY)
Admission: EM | Admit: 2019-11-24 | Discharge: 2019-11-24 | Disposition: A | Discharge status: Home / Self Care | Payer: Self-pay | Attending: Emergency Medicine | Admitting: Emergency Medicine

## 2019-11-24 DIAGNOSIS — N760 Acute vaginitis: Secondary | ICD-10-CM | POA: Insufficient documentation

## 2019-11-24 DIAGNOSIS — N390 Urinary tract infection, site not specified: Secondary | ICD-10-CM | POA: Insufficient documentation

## 2019-11-24 DIAGNOSIS — R102 Pelvic and perineal pain: Secondary | ICD-10-CM

## 2019-11-24 DIAGNOSIS — R1030 Lower abdominal pain, unspecified: Secondary | ICD-10-CM

## 2019-11-24 DIAGNOSIS — F17210 Nicotine dependence, cigarettes, uncomplicated: Secondary | ICD-10-CM | POA: Insufficient documentation

## 2019-11-24 LAB — WET PREP, GENITAL
Sperm: NONE SEEN
Trich, Wet Prep: NONE SEEN
Yeast Wet Prep HPF POC: NONE SEEN

## 2019-11-24 LAB — CBC WITH DIFFERENTIAL/PLATELET
Abs Immature Granulocytes: 0.03 10*3/uL (ref 0.00–0.07)
Basophils Absolute: 0.1 10*3/uL (ref 0.0–0.1)
Basophils Relative: 1 %
Eosinophils Absolute: 0 10*3/uL (ref 0.0–0.5)
Eosinophils Relative: 1 %
HCT: 39.3 % (ref 36.0–46.0)
Hemoglobin: 13.5 g/dL (ref 12.0–15.0)
Immature Granulocytes: 0 %
Lymphocytes Relative: 30 %
Lymphs Abs: 2.1 10*3/uL (ref 0.7–4.0)
MCH: 33.8 pg (ref 26.0–34.0)
MCHC: 34.4 g/dL (ref 30.0–36.0)
MCV: 98.3 fL (ref 80.0–100.0)
Monocytes Absolute: 0.5 10*3/uL (ref 0.1–1.0)
Monocytes Relative: 7 %
Neutro Abs: 4.3 10*3/uL (ref 1.7–7.7)
Neutrophils Relative %: 61 %
Platelets: 203 10*3/uL (ref 150–400)
RBC: 4 MIL/uL (ref 3.87–5.11)
RDW: 12.1 % (ref 11.5–15.5)
WBC: 7 10*3/uL (ref 4.0–10.5)
nRBC: 0 % (ref 0.0–0.2)

## 2019-11-24 LAB — COMPREHENSIVE METABOLIC PANEL
ALT: 17 U/L (ref 0–44)
AST: 20 U/L (ref 15–41)
Albumin: 4.3 g/dL (ref 3.5–5.0)
Alkaline Phosphatase: 39 U/L (ref 38–126)
Anion gap: 9 (ref 5–15)
BUN: 6 mg/dL (ref 6–20)
CO2: 23 mmol/L (ref 22–32)
Calcium: 9 mg/dL (ref 8.9–10.3)
Chloride: 107 mmol/L (ref 98–111)
Creatinine, Ser: 0.58 mg/dL (ref 0.44–1.00)
GFR calc Af Amer: >60 mL/min (ref >60)
GFR calc non Af Amer: >60 mL/min (ref >60)
Glucose, Bld: 106 mg/dL — ABNORMAL HIGH (ref 70–99)
Potassium: 3.5 mmol/L (ref 3.5–5.1)
Sodium: 139 mmol/L (ref 135–145)
Total Bilirubin: 1.1 mg/dL (ref 0.3–1.2)
Total Protein: 7.5 g/dL (ref 6.5–8.1)

## 2019-11-24 LAB — URINALYSIS, ROUTINE W REFLEX MICROSCOPIC
Bilirubin Urine: NEGATIVE
Glucose, UA: NEGATIVE mg/dL
Ketones, ur: NEGATIVE mg/dL
Nitrite: NEGATIVE
Protein, ur: NEGATIVE mg/dL
Specific Gravity, Urine: 1.003 — ABNORMAL LOW (ref 1.005–1.030)
pH: 8 (ref 5.0–8.0)

## 2019-11-24 LAB — POC URINE PREG, ED: Preg Test, Ur: NEGATIVE

## 2019-11-24 LAB — LIPASE, BLOOD: Lipase: 41 U/L (ref 11–51)

## 2019-11-24 MED ORDER — CEPHALEXIN 500 MG PO CAPS: 500.0000 mg | ORAL_CAPSULE | Freq: Three times a day (TID) | ORAL | 0 refills | Status: AC

## 2019-11-24 MED ORDER — CEPHALEXIN 500 MG PO CAPS
500.0000 mg | ORAL_CAPSULE | Freq: Once | ORAL | Status: AC
  Administered 2019-11-24: 500 mg via ORAL
  Filled 2019-11-24: qty 1

## 2019-11-24 MED ORDER — METRONIDAZOLE 500 MG PO TABS
500.0000 mg | ORAL_TABLET | Freq: Two times a day (BID) | ORAL | 0 refills | Status: DC
Start: 2019-11-24 — End: 2023-10-12

## 2019-11-24 NOTE — ED Notes (Signed)
Sent urine culture to the main lab

## 2019-11-24 NOTE — ED Provider Notes (Signed)
[facility] DEPT Provider Note   CSN: [id number] Arrival date & time: 11/24/19  1117     History Chief Complaint  Patient presents with  . Abdominal Pain  . Vaginal Discharge    Kim Harvey is a 29 y.o. female.  Kim Harvey is a 29 y.o. female with a history of headache, heart murmur, and sexually transmitted infections, who presents to the ED for evaluation of lower abdominal pain, urinary frequency, vaginal discharge.  Symptoms have been present for about 2 weeks, they initially started with some intermittent abdominal pains.  She reports in the past week she has been having some more frequent pain in the left lower quadrant.  She reports that she has urinary frequency, but when she goes only goes a small amount, but has not experienced dysuria or hematuria.  Has noticed a white discharge with slight odor.  No vaginal pain.  Has not noticed any bumps or lesions.  Last menstrual cycle at the end of May which was normal for her.  She reports that she is sexually active and does not consistently use birth control or protection.  Reports she had similar symptoms a few months ago and was diagnosed with UTI.        Past Medical History:  Diagnosis Date  . H/O chlamydia infection   . Headache(784.0)   . Heart murmur   . Trichimoniasis     Patient Active Problem List   Diagnosis Date Noted  . 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 ROOT CANAL    . WISDOM TOOTH EXTRACTION       OB History    Gravida  2   Para  2   Term  2   Preterm  0   AB  0   Living  2     SAB  0   TAB  0   Ectopic  0   Multiple  0   Live Births  2           Family History  Problem Relation Age of Onset  . Asthma Mother   . Hypertension Mother   . Asthma Father   . Heart disease Father   . Hypertension Father   . Hyperlipidemia Father   . Vision loss Father   .  Anesthesia problems Neg Hx   . Hypotension Neg Hx   . Malignant hyperthermia Neg Hx   . Pseudochol deficiency Neg Hx   . Other Neg Hx     Social History   Tobacco Use  . Smoking status: Current Every Day Smoker    Packs/day: 0.50    Years: 6.00    Pack years: 3.00    Types: Cigarettes  . Smokeless tobacco: Never Used  Vaping Use  . Vaping Use: Never used  Substance Use Topics  . Alcohol use: No  . Drug use: Not Currently    Types: Marijuana    Comment: last use 5 months ago    Home Medications Prior to Admission medications   Medication Sig Start Date End Date Taking? Authorizing Provider  cephALEXin (KEFLEX) 500 MG capsule Take 1 capsule (500 mg total) by mouth 3 (three) times daily for 7 days. 11/24/19 12/01/19  Jacqlyn Larsen, PA-C  EPINEPHrine 0.3 mg/0.3 mL IJ SOAJ injection Inject 0.3 mLs (0.3 mg total) into the muscle once. Patient not taking: Reported on 07/06/2019 11/05/15   Shary Decamp, PA-C  ibuprofen (ADVIL) 200 MG tablet Take 800 mg by mouth every 6 (six) hours as needed for headache or moderate pain.    [provider]  metroNIDAZOLE (FLAGYL) 500 MG tablet Take 1 tablet (500 mg total) by mouth 2 (two) times daily. One po bid x 7 days 11/24/19   Dartha Lodge, PA-C  phenazopyridine (PYRIDIUM) 200 MG tablet Take 1 tablet (200 mg total) by mouth 3 (three) times daily. 07/06/19   Linwood Dibbles, MD    Allergies    Patient has no known allergies.  Review of Systems   Review of Systems  Constitutional: Negative for chills and fever.  HENT: Negative.   Respiratory: Negative for cough and shortness of breath.   Cardiovascular: Negative for chest pain.  Gastrointestinal: Positive for abdominal pain and nausea. Negative for constipation, diarrhea and vomiting.  Genitourinary: Positive for frequency, pelvic pain and vaginal discharge. Negative for dysuria, flank pain, hematuria and vaginal bleeding.  Musculoskeletal: Negative for arthralgias and myalgias.  Skin: Negative  for color change and rash.  Neurological: Negative for dizziness, syncope and light-headedness.  All other systems reviewed and are negative.   Physical Exam Updated Vital Signs BP (!) 128/94 (BP Location: Left Arm)   Pulse 77   Temp 98.8 F (37.1 C) (Oral)   Resp 16   Ht 5\' 6"  (1.676 m)   Wt 59.9 kg   LMP 10/24/2019 (Approximate)   SpO2 100%   BMI 21.31 kg/m   Physical Exam Vitals and nursing note reviewed.  Constitutional:      General: She is not in acute distress.    Appearance: She is well-developed and normal weight. She is not ill-appearing or diaphoretic.  HENT:     Head: Normocephalic and atraumatic.  Eyes:     General:        Right eye: No discharge.        Left eye: No discharge.     Pupils: Pupils are equal, round, and reactive to light.  Cardiovascular:     Rate and Rhythm: Normal rate and regular rhythm.     Heart sounds: Normal heart sounds. No murmur heard.  No friction rub. No gallop.   Pulmonary:     Effort: Pulmonary effort is normal. No respiratory distress.     Breath sounds: Normal breath sounds. No wheezing or rales.     Comments: Respirations equal and unlabored, patient able to speak in full sentences, lungs clear to auscultation bilaterally Abdominal:     General: Bowel sounds are normal. There is no distension.     Palpations: Abdomen is soft. There is no mass.     Tenderness: There is abdominal tenderness. There is no guarding.     Comments: Abdomen is soft, nondistended, bowel sounds present throughout, there is mild tenderness present across the lower abdomen that does not localize to 1 side.  Genitourinary:    Comments: Chaperone present during pelvic exam. No external genital lesions noted. Speculum exam reveals moderate amount of thin gray discharge with odor, no vaginal bleeding, cervix appears normal On bimanual exam patient does not have any cervical motion tenderness, but does have some right-sided adnexal tenderness and fullness  without palpable discrete mass, no tenderness or masses noted over the right adnexa. Musculoskeletal:        General: No deformity.     Cervical back: Neck supple.  Skin:    General: Skin is warm and dry.     Capillary Refill: Capillary refill takes less than 2  seconds.  Neurological:     Mental Status: She is alert.     Coordination: Coordination normal.     Comments: Speech is clear, able to follow commands Moves extremities without ataxia, coordination intact  Psychiatric:        Mood and Affect: Mood normal.        Behavior: Behavior normal.     ED Results / Procedures / Treatments   Labs (all labs ordered are listed, but only abnormal results are displayed) Labs Reviewed  WET PREP, GENITAL - Abnormal; Notable for the following components:      Result Value   Clue Cells Wet Prep HPF POC PRESENT (*)    WBC, Wet Prep HPF POC FEW (*)    All other components within normal limits  URINALYSIS, ROUTINE W REFLEX MICROSCOPIC - Abnormal; Notable for the following components:   Color, Urine STRAW (*)    APPearance HAZY (*)    Specific Gravity, Urine 1.003 (*)    Hgb urine dipstick LARGE (*)    Leukocytes,Ua LARGE (*)    Bacteria, UA MANY (*)    All other components within normal limits  COMPREHENSIVE METABOLIC PANEL - Abnormal; Notable for the following components:   Glucose, Bld 106 (*)    All other components within normal limits  URINE CULTURE  LIPASE, BLOOD  CBC WITH DIFFERENTIAL/PLATELET  POC URINE PREG, ED  GC/CHLAMYDIA PROBE AMP ([place]) NOT AT [facility]    EKG None  Radiology US PELVIC COMPLETE WITH TRANSVAGINAL  Result Date: 11/24/2019 CLINICAL DATA:  Pelvic cramping EXAM: TRANSABDOMINAL AND TRANSVAGINAL ULTRASOUND OF PELVIS TECHNIQUE: Both transabdominal and transvaginal ultrasound examinations of the pelvis were performed. Transabdominal technique was performed for global imaging of the pelvis including uterus, ovaries, adnexal regions, and pelvic cul-de-sac. It  was necessary to proceed with endovaginal exam following the transabdominal exam to visualize the pelvic viscera. COMPARISON:  08/27/2015 FINDINGS: Uterus Measurements: 8.2 x 4.0 by 4.5 cm = volume: 74 mL. No fibroids or other mass visualized. Endometrium Thickness: 7 mm.  No focal abnormality visualized. Right ovary Measurements: 4.8 x 2.5 by 2.9 cm = volume: 18 mL. Normal appearance/no adnexal mass. Left ovary Measurements: 2.8 x 1.7 x 2.7 cm = volume: 7 mL. Normal appearance/no adnexal mass. Other findings No abnormal free fluid. IMPRESSION: 1. Age-appropriate pelvic ultrasound. Electronically Signed   By: Sharlet Salina M.D.   On: 11/24/2019 19:01    Procedures Procedures (including critical care time)  Medications Ordered in ED Medications  cephALEXin (KEFLEX) capsule 500 mg (has no administration in time range)    ED Course  I have reviewed the triage vital signs and the nursing notes.  Pertinent labs & imaging results that were available during my care of the patient were reviewed by me and considered in my medical decision making (see chart for details).    MDM Rules/Calculators/A&P                          29 year old female presents with lower abdominal pain, urinary frequency and vaginal discharge.  On arrival she is well-appearing with normal vitals.  She has mild tenderness across the lower abdomen but no peritoneal signs, no CVA tenderness.  On pelvic exam she does have moderate amount of thin gray discharge, does not have cervical motion tenderness but does have some left adnexal tenderness and fullness.  She does report that intermittently the pain is more severe in this area.  Will get abdominal  labs, wet prep and STD testing sent, and given adnexal tenderness we will also get ultrasound to assess for potential TOA or ovarian cyst.  I have very low suspicion for ovarian torsion since symptoms have been ongoing for over a week now and patient appears comfortable and in no acute  distress.  I have independently ordered, reviewed and interpreted all labs and imaging: CBC: No leukocytosis, normal hemoglobin CMP: Glucose 106, no other electrolyte derangements, normal renal and liver function Lipase: WNL Pregnancy: Negative Urinalysis: Large leukocytes with WBCs and RBCs present as well as bacteria Wet prep: Clue cells present, few WBCs  Pelvic ultrasound is unremarkable without evidence of ovarian cyst or TOA.  Suspect abdominal pain is related to UTI, will also treat with Flagyl for BV.  Patient has had a UTI a few months ago with very similar symptoms.  Antibiotics given here in the ED, symptomatic care and return precautions discussed.  Patient expresses understanding and agreement.  Discharged home in good condition    Final Clinical Impression(s) / ED Diagnoses Final diagnoses:  Acute UTI  BV (bacterial vaginosis)  Lower abdominal pain    Rx / DC Orders ED Discharge Orders         Ordered    cephALEXin (KEFLEX) 500 MG capsule  3 times daily     Discontinue  Reprint     11/24/19 1922    metroNIDAZOLE (FLAGYL) 500 MG tablet  2 times daily     Discontinue  Reprint     11/24/19 1922           Dartha Lodge, [place] 11/24/19 2059    Bethann Berkshire, MD 11/24/19 2244

## 2019-11-24 NOTE — Discharge Instructions (Signed)
You have a urinary tract infection that is likely causing your pain and urinary symptoms.  Take antibiotics (Keflex/cephalexin) 3 times daily as directed.  Make sure you complete entire course even if symptoms improve.  Your wet prep also shows bacterial vaginosis, which is an overgrowth of the natural bacteria in the vagina that can cause discharge, odor and discomfort.  Take Flagyl twice daily to treat this.  Do not drink alcohol while taking this antibiotic.

## 2019-11-24 NOTE — ED Triage Notes (Signed)
Patient c/o lower abdominal cramping, white vaginal discharge, urine with foul odor and cloudy since 11/06/19

## 2019-11-26 LAB — URINE CULTURE: Culture: 40000 — AB

## 2019-11-27 ENCOUNTER — Telehealth: Payer: Self-pay | Admitting: Emergency Medicine

## 2019-11-27 LAB — GC/CHLAMYDIA PROBE AMP (~~LOC~~) NOT AT ARMC
Chlamydia: NEGATIVE
Comment: NEGATIVE
Comment: NORMAL
Neisseria Gonorrhea: NEGATIVE

## 2019-11-27 NOTE — Telephone Encounter (Signed)
Post ED Visit - Positive Culture Follow-up  Culture report reviewed by antimicrobial stewardship pharmacist: [facility] []  , Pharm.D. []  Enzo Bi, Pharm.D., BCPS AQ-ID []  , Pharm.D., BCPS []  Celedonio Miyamoto, Pharm.D., BCPS []  [place], Garvin Fila.D., BCPS, AAHIVP []  , Pharm.D., BCPS, AAHIVP []  Georgina Pillion, PharmD, BCPS []  , PharmD, BCPS []  [place], PharmD, BCPS []  [address], PharmD []  , PharmD, BCPS []  Estella Husk, PharmD  Pharmacy Team []  Lysle Pearl, PharmD []  , PharmD []  Phillips Climes, PharmD []  , Rph []  Agapito Games) , PharmD []  Verlan Friends, PharmD []  , PharmD []  Mervyn Gay, PharmD []  , PharmD []  Vinnie Level, PharmD []  [facility], PharmD []  , PharmD []  Len Childs, PharmD   Positive urine culture Treated with cephalexin, organism sensitive to the same and no further patient follow-up is required at this time.  11/27/2019, 11:25 AM

## 2020-02-16 ENCOUNTER — Other Ambulatory Visit: Payer: Self-pay

## 2020-02-16 DIAGNOSIS — Z20822 Contact with and (suspected) exposure to covid-19: Secondary | ICD-10-CM

## 2020-02-19 LAB — NOVEL CORONAVIRUS, NAA: SARS-CoV-2, NAA: NOT DETECTED

## 2020-07-04 ENCOUNTER — Encounter (HOSPITAL_COMMUNITY): Payer: Self-pay

## 2020-07-04 ENCOUNTER — Emergency Department (HOSPITAL_COMMUNITY)
Admission: EM | Admit: 2020-07-04 | Discharge: 2020-07-04 | Disposition: A | Discharge status: Home / Self Care | Payer: Self-pay | Attending: Emergency Medicine | Admitting: Emergency Medicine

## 2020-07-04 ENCOUNTER — Other Ambulatory Visit: Payer: Self-pay

## 2020-07-04 DIAGNOSIS — R457 State of emotional shock and stress, unspecified: Secondary | ICD-10-CM | POA: Insufficient documentation

## 2020-07-04 DIAGNOSIS — F410 Panic disorder [episodic paroxysmal anxiety] without agoraphobia: Secondary | ICD-10-CM | POA: Insufficient documentation

## 2020-07-04 DIAGNOSIS — Z5321 Procedure and treatment not carried out due to patient leaving prior to being seen by health care provider: Secondary | ICD-10-CM | POA: Insufficient documentation

## 2020-07-04 LAB — CBC
HCT: 40 % (ref 36.0–46.0)
Hemoglobin: 14.1 g/dL (ref 12.0–15.0)
MCH: 33.3 pg (ref 26.0–34.0)
MCHC: 35.3 g/dL (ref 30.0–36.0)
MCV: 94.3 fL (ref 80.0–100.0)
Platelets: 240 10*3/uL (ref 150–400)
RBC: 4.24 MIL/uL (ref 3.87–5.11)
RDW: 11.8 % (ref 11.5–15.5)
WBC: 8.2 10*3/uL (ref 4.0–10.5)
nRBC: 0 % (ref 0.0–0.2)

## 2020-07-04 LAB — BASIC METABOLIC PANEL
Anion gap: 14 (ref 5–15)
BUN: 11 mg/dL (ref 6–20)
CO2: 21 mmol/L — ABNORMAL LOW (ref 22–32)
Calcium: 10.4 mg/dL — ABNORMAL HIGH (ref 8.9–10.3)
Chloride: 106 mmol/L (ref 98–111)
Creatinine, Ser: 0.52 mg/dL (ref 0.44–1.00)
GFR, Estimated: >60 mL/min (ref >60)
Glucose, Bld: 95 mg/dL (ref 70–99)
Potassium: 3.4 mmol/L — ABNORMAL LOW (ref 3.5–5.1)
Sodium: 141 mmol/L (ref 135–145)

## 2020-07-04 LAB — I-STAT BETA HCG BLOOD, ED (MC, WL, AP ONLY): I-stat hCG, quantitative: <5 m[IU]/mL (ref <5)

## 2020-07-04 LAB — CBG MONITORING, ED: Glucose-Capillary: 95 mg/dL (ref 70–99)

## 2020-07-04 NOTE — ED Notes (Signed)
Pt. CBG 95, RN,Jeneen made aware.
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
## 2020-09-11 ENCOUNTER — Emergency Department (HOSPITAL_COMMUNITY): Payer: No Typology Code available for payment source

## 2020-09-11 ENCOUNTER — Encounter (HOSPITAL_COMMUNITY): Payer: Self-pay

## 2020-09-11 ENCOUNTER — Other Ambulatory Visit: Payer: Self-pay

## 2020-09-11 ENCOUNTER — Emergency Department (HOSPITAL_COMMUNITY)
Admission: EM | Admit: 2020-09-11 | Discharge: 2020-09-11 | Disposition: A | Discharge status: Home / Self Care | Payer: No Typology Code available for payment source | Attending: Emergency Medicine | Admitting: Emergency Medicine

## 2020-09-11 DIAGNOSIS — Z3A01 Less than 8 weeks gestation of pregnancy: Secondary | ICD-10-CM | POA: Insufficient documentation

## 2020-09-11 DIAGNOSIS — Y92410 Unspecified street and highway as the place of occurrence of the external cause: Secondary | ICD-10-CM | POA: Insufficient documentation

## 2020-09-11 DIAGNOSIS — O9A211 Injury, poisoning and certain other consequences of external causes complicating pregnancy, first trimester: Secondary | ICD-10-CM | POA: Insufficient documentation

## 2020-09-11 DIAGNOSIS — R1030 Lower abdominal pain, unspecified: Secondary | ICD-10-CM | POA: Insufficient documentation

## 2020-09-11 DIAGNOSIS — M25562 Pain in left knee: Secondary | ICD-10-CM | POA: Diagnosis not present

## 2020-09-11 DIAGNOSIS — F17210 Nicotine dependence, cigarettes, uncomplicated: Secondary | ICD-10-CM | POA: Insufficient documentation

## 2020-09-11 LAB — I-STAT BETA HCG BLOOD, ED (MC, WL, AP ONLY): I-stat hCG, quantitative: >2000 m[IU]/mL — ABNORMAL HIGH (ref <5)

## 2020-09-11 MED ORDER — DICLOFENAC SODIUM 1 % EX GEL: 2.0000 g | Freq: Four times a day (QID) | CUTANEOUS | 0 refills | Status: DC

## 2020-09-11 MED ORDER — ACETAMINOPHEN 500 MG PO TABS
1000.0000 mg | ORAL_TABLET | Freq: Once | ORAL | Status: AC
  Administered 2020-09-11: 1000 mg via ORAL
  Filled 2020-09-11: qty 2

## 2020-09-11 MED ORDER — LIDOCAINE 4 % EX PTCH: 1.0000 | MEDICATED_PATCH | Freq: Four times a day (QID) | CUTANEOUS | 0 refills | Status: DC | PRN

## 2020-09-11 NOTE — Progress Notes (Signed)
Orthopedic Tech Progress Note Patient Details:  Shonna Deiter [birth year] [id number]  Ortho Devices Type of Ortho Device: Crutches,Knee Immobilizer Ortho Device/Splint Location: LLE Ortho Device/Splint Interventions: Ordered,Application   Post Interventions Patient Tolerated: Well Instructions Provided: Adjustment of device   Maurene Capes 09/11/2020, 11:52 PM
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
## 2020-09-11 NOTE — ED Notes (Signed)
xay at bedside

## 2020-09-11 NOTE — ED Notes (Signed)
Ortho tech called 
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
## 2020-09-11 NOTE — Discharge Instructions (Addendum)
Please follow-up with your OB/GYN and your primary care provider.  You may use Tylenol 1000 mg every 6 hours as needed for pain.  Drink plenty of water.  Return to ER for any vaginal bleeding or any new or concerning symptoms.  Expect that you will feel very achy tomorrow.  This is normal.  I prescribed you Voltaren gel and lidocaine patches which she may use in conjunction with Tylenol.

## 2021-07-14 ENCOUNTER — Other Ambulatory Visit: Payer: Self-pay

## 2021-07-14 ENCOUNTER — Encounter (HOSPITAL_BASED_OUTPATIENT_CLINIC_OR_DEPARTMENT_OTHER): Payer: Self-pay

## 2021-07-14 ENCOUNTER — Emergency Department (HOSPITAL_BASED_OUTPATIENT_CLINIC_OR_DEPARTMENT_OTHER)
Admission: EM | Admit: 2021-07-14 | Discharge: 2021-07-14 | Discharge status: Against Medical Advice | Payer: Medicaid Other | Attending: Emergency Medicine | Admitting: Emergency Medicine

## 2021-07-14 DIAGNOSIS — R040 Epistaxis: Secondary | ICD-10-CM | POA: Diagnosis not present

## 2021-07-14 DIAGNOSIS — Z5321 Procedure and treatment not carried out due to patient leaving prior to being seen by health care provider: Secondary | ICD-10-CM | POA: Diagnosis not present

## 2021-07-14 DIAGNOSIS — O99892 Other specified diseases and conditions complicating childbirth: Secondary | ICD-10-CM | POA: Diagnosis not present

## 2021-07-14 NOTE — ED Triage Notes (Addendum)
Pt postpartum 12/2 csection- c/o vaginal bleeding since 1/16-2 days after having birth control device place right UE-also c/o nosebleed x 1 hour this am/feeling weak-no nosebleed at present-NAD-steady gait

## 2023-02-09 ENCOUNTER — Emergency Department (HOSPITAL_BASED_OUTPATIENT_CLINIC_OR_DEPARTMENT_OTHER)
Admission: EM | Admit: 2023-02-09 | Discharge: 2023-02-09 | Disposition: A | Discharge status: Home / Self Care | Payer: Medicaid Other | Attending: Emergency Medicine | Admitting: Emergency Medicine

## 2023-02-09 ENCOUNTER — Other Ambulatory Visit: Payer: Self-pay

## 2023-02-09 ENCOUNTER — Encounter (HOSPITAL_BASED_OUTPATIENT_CLINIC_OR_DEPARTMENT_OTHER): Payer: Self-pay | Admitting: Pediatrics

## 2023-02-09 DIAGNOSIS — M545 Low back pain, unspecified: Secondary | ICD-10-CM | POA: Diagnosis not present

## 2023-02-09 DIAGNOSIS — R519 Headache, unspecified: Secondary | ICD-10-CM | POA: Diagnosis present

## 2023-02-09 DIAGNOSIS — U071 COVID-19: Secondary | ICD-10-CM | POA: Insufficient documentation

## 2023-02-09 LAB — URINALYSIS, W/ REFLEX TO CULTURE (INFECTION SUSPECTED)
Bilirubin Urine: NEGATIVE
Glucose, UA: NEGATIVE mg/dL
Ketones, ur: NEGATIVE mg/dL
Nitrite: NEGATIVE
Protein, ur: NEGATIVE mg/dL
Specific Gravity, Urine: 1.015 (ref 1.005–1.030)
pH: 6.5 (ref 5.0–8.0)

## 2023-02-09 LAB — SARS CORONAVIRUS 2 BY RT PCR: SARS Coronavirus 2 by RT PCR: POSITIVE — AB

## 2023-02-09 LAB — PREGNANCY, URINE: Preg Test, Ur: NEGATIVE

## 2023-02-09 MED ORDER — ACETAMINOPHEN 500 MG PO TABS
1000.0000 mg | ORAL_TABLET | Freq: Once | ORAL | Status: AC
  Administered 2023-02-09: 1000 mg via ORAL
  Filled 2023-02-09: qty 2

## 2023-02-09 NOTE — ED Provider Notes (Signed)
Emergency Department Provider Note   I have reviewed the triage vital signs and the nursing notes.   HISTORY  Chief Complaint Back Pain and Headache   HPI Kim Harvey is a 32 y.o. female with past history reviewed below presents emergency department for evaluation of lower back discomfort and headache.  Symptoms have gradually developed over the past 24 hours.  No fevers.  She has a gradually worsening throbbing headache along with mid to lower back discomfort.  No unilateral back or flank pain.  No dysuria, hesitancy, urgency.  No vaginal bleeding or discharge.  She was treated within the past month for dysuria and bacterial vaginosis.  Symptoms resolved after treatment.  Denies chest pain or shortness of breath.  No abdominal pain, vomiting, diarrhea.  She has taken no medication prior to ED evaluation.   Past Medical History:  Diagnosis Date   H/O chlamydia infection    Headache(784.0)    Heart murmur    Trichimoniasis     Review of Systems  Constitutional: No fever/chills Cardiovascular: Denies chest pain. Respiratory: Denies shortness of breath. Gastrointestinal: No abdominal pain.  No nausea, no vomiting.  No diarrhea.  No constipation. Genitourinary: Negative for dysuria. Musculoskeletal: Positive for back pain. Skin: Negative for rash. Neurological: Negative for focal weakness or numbness. Positive HA.   ____________________________________________   PHYSICAL EXAM:  VITAL SIGNS: ED Triage Vitals  Encounter Vitals Group     BP 02/09/23 1504 133/86     Pulse Rate 02/09/23 1504 (!) 107     Resp 02/09/23 1504 18     Temp 02/09/23 1504 99.6 F (37.6 C)     SpO2 02/09/23 1504 97 %     Weight 02/09/23 1503 135 lb (61.2 kg)     Height 02/09/23 1503 5\' 5"  (1.651 m)   Constitutional: Alert and oriented. Well appearing and in no acute distress. Eyes: Conjunctivae are normal.  Head: Atraumatic. Nose: No congestion/rhinnorhea. Mouth/Throat: Mucous  membranes are moist.  Oropharynx non-erythematous. Neck: No stridor.  No meningeal signs. Cardiovascular: Normal rate, regular rhythm. Good peripheral circulation. Grossly normal heart sounds.   Respiratory: Normal respiratory effort.  No retractions. Lungs CTAB. Gastrointestinal: Soft and nontender. No distention.  Musculoskeletal: No gross deformities of extremities. Neurologic:  Normal speech and language. No gross focal neurologic deficits are appreciated.  Skin:  Skin is warm, dry and intact. No rash noted.  ____________________________________________   LABS (all labs ordered are listed, but only abnormal results are displayed)  Labs Reviewed  SARS CORONAVIRUS 2 BY RT PCR - Abnormal; Notable for the following components:      Result Value   SARS Coronavirus 2 by RT PCR POSITIVE (*)    All other components within normal limits  URINALYSIS, W/ REFLEX TO CULTURE (INFECTION SUSPECTED) - Abnormal; Notable for the following components:   Hgb urine dipstick MODERATE (*)    Leukocytes,Ua TRACE (*)    Bacteria, UA RARE (*)    All other components within normal limits  PREGNANCY, URINE   ____________________________________________   PROCEDURES  Procedure(s) performed:   Procedures  None  ____________________________________________   INITIAL IMPRESSION / ASSESSMENT AND PLAN / ED COURSE  Pertinent labs & imaging results that were available during my care of the patient were reviewed by me and considered in my medical decision making (see chart for details).   This patient is Presenting for Evaluation of back pain, which does require a range of treatment options, and is a complaint that involves a  high risk of morbidity and mortality.  The Differential Diagnoses includes but is not exclusive to musculoskeletal back pain, renal colic, urinary tract infection, pyelonephritis, intra-abdominal causes of back pain, aortic aneurysm or dissection, cauda equina syndrome, sciatica,  lumbar disc disease, thoracic disc disease, etc.   Critical Interventions-    Medications  acetaminophen (TYLENOL) tablet 1,000 mg (1,000 mg Oral Given 02/09/23 1616)    Reassessment after intervention: pain improved.   I decided to review pertinent External Data, and in summary patient treated for BV and dysuria on 01/12/23 in the [facility] system.   Clinical Laboratory Tests Ordered, included COVID positive. UA without infection. Pregnancy negative.   Radiologic Tests: Considered neuro/spine imaging but no red flag signs/symptoms.   Cardiac Monitor Tracing which shows NSR.    Social Determinants of Health Risk patient is a smoker.   Medical Decision Making: Summary:  Patient presents emergency department for evaluation of low back pain which is more diffuse without neurodeficit.  Headache is gradual in onset and throbbing in nature.  No focal neurodeficits, vision changes, meningismus.  Patient is not febrile here.  Clinically seems most consistent with viral process.  Plan for UA, pregnancy test, COVID swab.  Reevaluation with update and discussion with patient. Discussed COVID test result. Patient is low risk and does not want anti-viral medication. Discussed PCP follow up, quarantine, and supportive care at home. Suspect HA and back pain are viral in nature. Discussed ED return precautions.   Patient's presentation is most consistent with acute, uncomplicated illness.   Disposition: discharge  ____________________________________________  FINAL CLINICAL IMPRESSION(S) / ED DIAGNOSES  Final diagnoses:  COVID-19    Note:  This document was prepared using Dragon voice recognition software and may include unintentional dictation errors.  Alona Bene, MD, [facility] Emergency Medicine    Osvaldo Lamping, Arlyss Repress, MD 02/09/23 1700

## 2023-02-09 NOTE — ED Notes (Signed)
ED Provider at bedside. 

## 2023-02-09 NOTE — ED Triage Notes (Signed)
C/O low back pain since last night and headache when she woke up this morning.

## 2023-02-09 NOTE — Discharge Instructions (Signed)
You have COVID 19. This is a contagious virus and you should avoid close contact with others. Please take tylenol and/or motrin as needed for pain. Return to the ED with any new or suddenly worsening symptoms.

## 2023-10-12 ENCOUNTER — Emergency Department (HOSPITAL_BASED_OUTPATIENT_CLINIC_OR_DEPARTMENT_OTHER): Payer: Self-pay

## 2023-10-12 ENCOUNTER — Encounter (HOSPITAL_BASED_OUTPATIENT_CLINIC_OR_DEPARTMENT_OTHER): Payer: Self-pay | Admitting: Urology

## 2023-10-12 ENCOUNTER — Emergency Department (HOSPITAL_BASED_OUTPATIENT_CLINIC_OR_DEPARTMENT_OTHER)
Admission: EM | Admit: 2023-10-12 | Discharge: 2023-10-12 | Disposition: A | Discharge status: Home / Self Care | Payer: Self-pay | Attending: Emergency Medicine | Admitting: Emergency Medicine

## 2023-10-12 ENCOUNTER — Other Ambulatory Visit: Payer: Self-pay

## 2023-10-12 DIAGNOSIS — F17210 Nicotine dependence, cigarettes, uncomplicated: Secondary | ICD-10-CM | POA: Insufficient documentation

## 2023-10-12 DIAGNOSIS — R102 Pelvic and perineal pain: Secondary | ICD-10-CM | POA: Insufficient documentation

## 2023-10-12 DIAGNOSIS — Z202 Contact with and (suspected) exposure to infections with a predominantly sexual mode of transmission: Secondary | ICD-10-CM | POA: Insufficient documentation

## 2023-10-12 LAB — CBC
HCT: 32.4 % — ABNORMAL LOW (ref 36.0–46.0)
Hemoglobin: 10.1 g/dL — ABNORMAL LOW (ref 12.0–15.0)
MCH: 26.2 pg (ref 26.0–34.0)
MCHC: 31.2 g/dL (ref 30.0–36.0)
MCV: 84.2 fL (ref 80.0–100.0)
Platelets: 180 10*3/uL (ref 150–400)
RBC: 3.85 MIL/uL — ABNORMAL LOW (ref 3.87–5.11)
RDW: 15 % (ref 11.5–15.5)
WBC: 6.9 10*3/uL (ref 4.0–10.5)
nRBC: 0 % (ref 0.0–0.2)

## 2023-10-12 LAB — COMPREHENSIVE METABOLIC PANEL WITH GFR
ALT: 17 U/L (ref 0–44)
AST: 30 U/L (ref 15–41)
Albumin: 4.2 g/dL (ref 3.5–5.0)
Alkaline Phosphatase: 43 U/L (ref 38–126)
Anion gap: 10 (ref 5–15)
BUN: 7 mg/dL (ref 6–20)
CO2: 24 mmol/L (ref 22–32)
Calcium: 9 mg/dL (ref 8.9–10.3)
Chloride: 105 mmol/L (ref 98–111)
Creatinine, Ser: 0.6 mg/dL (ref 0.44–1.00)
GFR, Estimated: >60 mL/min (ref >60)
Glucose, Bld: 91 mg/dL (ref 70–99)
Potassium: 3.7 mmol/L (ref 3.5–5.1)
Sodium: 139 mmol/L (ref 135–145)
Total Bilirubin: 0.3 mg/dL (ref 0.0–1.2)
Total Protein: 6.7 g/dL (ref 6.5–8.1)

## 2023-10-12 LAB — URINALYSIS, ROUTINE W REFLEX MICROSCOPIC
Bilirubin Urine: NEGATIVE
Glucose, UA: NEGATIVE mg/dL
Ketones, ur: NEGATIVE mg/dL
Nitrite: NEGATIVE
Protein, ur: NEGATIVE mg/dL
Specific Gravity, Urine: 1.015 (ref 1.005–1.030)
pH: 7 (ref 5.0–8.0)

## 2023-10-12 LAB — URINALYSIS, MICROSCOPIC (REFLEX)

## 2023-10-12 LAB — WET PREP, GENITAL
Sperm: NONE SEEN
Trich, Wet Prep: NONE SEEN
WBC, Wet Prep HPF POC: <10 (ref <10)
Yeast Wet Prep HPF POC: NONE SEEN

## 2023-10-12 LAB — PREGNANCY, URINE: Preg Test, Ur: NEGATIVE

## 2023-10-12 LAB — HIV ANTIBODY (ROUTINE TESTING W REFLEX): HIV Screen 4th Generation wRfx: NONREACTIVE

## 2023-10-12 MED ORDER — METRONIDAZOLE 500 MG PO TABS
500.0000 mg | ORAL_TABLET | Freq: Once | ORAL | Status: AC
  Administered 2023-10-12: 500 mg via ORAL
  Filled 2023-10-12: qty 1

## 2023-10-12 MED ORDER — DOXYCYCLINE HYCLATE 100 MG PO TABS
100.0000 mg | ORAL_TABLET | Freq: Once | ORAL | Status: AC
  Administered 2023-10-12: 100 mg via ORAL
  Filled 2023-10-12: qty 1

## 2023-10-12 MED ORDER — METRONIDAZOLE 500 MG PO TABS: 500.0000 mg | ORAL_TABLET | Freq: Two times a day (BID) | ORAL | 0 refills | Status: DC

## 2023-10-12 MED ORDER — DOXYCYCLINE HYCLATE 100 MG PO CAPS: 100.0000 mg | ORAL_CAPSULE | Freq: Two times a day (BID) | ORAL | 0 refills | Status: DC

## 2023-10-12 MED ORDER — SODIUM CHLORIDE 0.9 % IV SOLN
1.0000 g | Freq: Once | INTRAVENOUS | Status: AC
  Administered 2023-10-12: 1 g via INTRAVENOUS
  Filled 2023-10-12: qty 10

## 2023-10-12 NOTE — ED Notes (Signed)
 Pelvic US  being done

## 2023-10-12 NOTE — ED Triage Notes (Signed)
 Pt states concern for STD, exposure to Chlamydia  States pelvic pain  No discharge

## 2023-10-12 NOTE — ED Provider Notes (Signed)
 [place] [facility] Provider Note   CSN: [id number] Arrival date & time: 10/12/23  1118     History  Chief Complaint  Patient presents with   Exposure to STD    Kim Harvey is a 33 y.o. female.   Exposure to STD   33 year old female presents emergency department with complaints of pelvic pain, STD exposure.  Patient states that her children's father recently tested positive for either chlamydia or gonorrhea but she is unsure of which exactly.  States that she just received the news.  States that she has been sexually active with him recently.  States that on Friday, developed pelvic pain.  Denies any obvious vaginal discharge or bleeding.  Does report some associated dysuria.  Denies any fevers, chills, change in bowel habits.  States her last menstrual cycle was during the first week of the month.  Past medical history significant for chlamydia, trichomoniasis, genital herpes  Home Medications Prior to Admission medications   Medication Sig Start Date End Date Taking? Authorizing Provider  doxycycline  (VIBRAMYCIN ) 100 MG capsule Take 1 capsule (100 mg total) by mouth 2 (two) times daily. 10/12/23  Yes Neil Balls A, PA  metroNIDAZOLE  (FLAGYL ) 500 MG tablet Take 1 tablet (500 mg total) by mouth 2 (two) times daily. 10/12/23  Yes Neil Balls A, PA  diclofenac  Sodium (VOLTAREN ) 1 % GEL Apply 2 g topically 4 (four) times daily. 09/11/20   Coretta Dexter, PA  EPINEPHrine  0.3 mg/0.3 mL IJ SOAJ injection Inject 0.3 mLs (0.3 mg total) into the muscle once. Patient not taking: Reported on 07/06/2019 11/05/15   Tonya Fredrickson, PA-C  ibuprofen  (ADVIL ) 200 MG tablet Take 800 mg by mouth every 6 (six) hours as needed for headache or moderate pain.    [provider]  Lidocaine  (HM LIDOCAINE  PATCH) 4 % PTCH Apply 1 patch topically 4 (four) times daily as needed (pain). 09/11/20   Coretta Dexter, PA  phenazopyridine  (PYRIDIUM ) 200 MG tablet  Take 1 tablet (200 mg total) by mouth 3 (three) times daily. 07/06/19   Trish Furl, MD      Allergies    Patient has no known allergies.    Review of Systems   Review of Systems  Physical Exam Updated Vital Signs BP 124/84 (BP Location: Left Arm)   Pulse 69   Temp (!) 97.5 F (36.4 C)   Resp 20   Ht 5\' 5"  (1.651 m)   Wt 61.2 kg   LMP 10/04/2023   SpO2 100%   BMI 22.45 kg/m  Physical Exam Vitals and nursing note reviewed. Exam conducted with a chaperone present.  Constitutional:      General: She is not in acute distress.    Appearance: She is well-developed.  HENT:     Head: Normocephalic and atraumatic.  Eyes:     Conjunctiva/sclera: Conjunctivae normal.  Cardiovascular:     Rate and Rhythm: Normal rate and regular rhythm.     Heart sounds: No murmur heard. Pulmonary:     Effort: Pulmonary effort is normal. No respiratory distress.     Breath sounds: Normal breath sounds.  Abdominal:     Palpations: Abdomen is soft.     Tenderness: There is abdominal tenderness.     Comments: Tenderness suprapubic region as well as left lower quadrant/left adnexal abdomen.  Genitourinary:    Comments: GU exam performed with female nurse staff at bedside.  External exam unremarkable.  Speculum exam showed white cloudy  discharge from cervical os.  Specimens obtained and sent for testing.  Manual exam did show evidence of cervical motion tenderness. Musculoskeletal:        General: No swelling.     Cervical back: Neck supple.  Skin:    General: Skin is warm and dry.     Capillary Refill: Capillary refill takes less than 2 seconds.  Neurological:     Mental Status: She is alert.  Psychiatric:        Mood and Affect: Mood normal.     ED Results / Procedures / Treatments   Labs (all labs ordered are listed, but only abnormal results are displayed) Labs Reviewed  WET PREP, GENITAL - Abnormal; Notable for the following components:      Result Value   Clue Cells Wet Prep HPF POC  PRESENT (*)    All other components within normal limits  URINALYSIS, ROUTINE W REFLEX MICROSCOPIC - Abnormal; Notable for the following components:   APPearance HAZY (*)    Hgb urine dipstick MODERATE (*)    Leukocytes,Ua SMALL (*)    All other components within normal limits  CBC - Abnormal; Notable for the following components:   RBC 3.85 (*)    Hemoglobin 10.1 (*)    HCT 32.4 (*)    All other components within normal limits  URINALYSIS, MICROSCOPIC (REFLEX) - Abnormal; Notable for the following components:   Bacteria, UA FEW (*)    All other components within normal limits  COMPREHENSIVE METABOLIC PANEL WITH GFR  PREGNANCY, URINE  HIV ANTIBODY (ROUTINE TESTING W REFLEX)  RPR  GC/CHLAMYDIA PROBE AMP ([place]) NOT AT [facility]    EKG None  Radiology US  PELVIC COMPLETE W TRANSVAGINAL AND TORSION R/O Result Date: 10/12/2023 CLINICAL DATA:  Pelvic pain. EXAM: TRANSABDOMINAL AND TRANSVAGINAL ULTRASOUND OF PELVIS TECHNIQUE: Both transabdominal and transvaginal ultrasound examinations of the pelvis were performed. Transabdominal technique was performed for global imaging of the pelvis including uterus, ovaries, adnexal regions, and pelvic cul-de-sac. It was necessary to proceed with endovaginal exam following the transabdominal exam to visualize the uterus, endometrium, ovaries, and adnexa. COMPARISON:  11/24/2019 FINDINGS: Uterus Measurements: 9.4 x 4.6 x 6.0 cm = volume: 134 mL. No fibroids or other mass visualized. Endometrium Thickness: 9 mm.  No focal abnormality visualized. Right ovary Measurements: 3.6 x 3.4 x 2.3 cm = volume: 14 mL. Normal appearance. Left ovary Measurements: 2.9 x 2.8 x 2.4 cm = volume: 10 mL. Normal appearance. Other findings Prominent vessels seen within the adnexa bilaterally. IMPRESSION: 1. No acute abnormality of the uterus or ovaries. 2. 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 Electronically Signed   By:  Elester Grim M.D.   On: 10/12/2023 13:18    Procedures Procedures    Medications Ordered in ED Medications  cefTRIAXone  (ROCEPHIN ) 1 g in sodium chloride  0.9 % 100 mL IVPB (1 g Intravenous New Bag/Given 10/12/23 1251)  doxycycline  (VIBRA -TABS) tablet 100 mg (100 mg Oral Given 10/12/23 1247)  metroNIDAZOLE  (FLAGYL ) tablet 500 mg (500 mg Oral Given 10/12/23 1247)    ED Course/ Medical Decision Making/ A&P                                 Medical Decision Making Amount and/or Complexity of Data Reviewed Labs: ordered. Radiology: ordered.  Risk Prescription drug management.   This patient presents to the ED for concern of chlamydia exposure, this  involves an extensive number of treatment options, and is a complaint that carries with it a high risk of complications and morbidity.  The differential diagnosis includes gonorrhea, chlamydia, trichomoniasis, HIV, syphilis, other STD, tubo-ovarian abscess, PID, UTI, ectopic pregnancy, IUP, other   Co morbidities that complicate the patient evaluation  See HPI   Additional history obtained:  Additional history obtained from EMR External records from outside source obtained and reviewed including hospital records   Lab Tests:  I Ordered, and personally interpreted labs.  The pertinent results include: UA with moderate hemoglobin, small leukocytes, few bacteria, 6 WBCs.  Contaminated sample with 11-20 squamous epithelial cells.  Wet prep with positive clue cells.  No leukocytosis.  Anemia with a hemoglobin of 10.1.  Platelets within normal range.  No Electra abnormalities.  No transaminitis.  No renal dysfunction.  STD testing pending.   Imaging Studies ordered:  I ordered imaging studies including pelvic ultrasound I independently visualized and interpreted imaging which showed no acute abnormality.  Prominent adnexa concerning for possible pelvic congestion syndrome I agree with the radiologist interpretation   Cardiac  Monitoring: / EKG:  The patient was maintained on a cardiac monitor.  I personally viewed and interpreted the cardiac monitored which showed an underlying rhythm of: Sinus rhythm   Consultations Obtained:  N/a   Problem List / ED Course / Critical interventions / Medication management  Pelvic pain, STD exposure I ordered medication including Rocephin , doxycycline , Flagyl    Reevaluation of the patient after these medicines showed that the patient stayed the same I have reviewed the patients home medicines and have made adjustments as needed   Social Determinants of Health:  Chronic cigarette use.  Denies illicit drug use.   Test / Admission - Considered:  Pelvic pain, STD exposure Vitals signs within normal range and stable throughout visit. Laboratory/imaging studies significant for: See above 33 year old female presents emergency department with complaints of pelvic pain, STD exposure.  Patient states that her children's father recently tested positive for either chlamydia or gonorrhea but she is unsure of which exactly.  States that she just received the news.  States that she has been sexually active with him recently.  States that on Friday, developed pelvic pain.  Denies any obvious vaginal discharge or bleeding.  Does report some associated dysuria.  Denies any fevers, chills, change in bowel habits.  States her last menstrual cycle was during the first week of the month. On exam, patient with tenderness suprapubic/pelvic region.  CMT appreciated on pelvic exam.  Laboratory studies concerning for positive clue cell concerning for BV.  STD testing pending.  Vaginal ultrasound was obtained given known exposure to STD with CMT concerning for possible abscess or other acute abnormality as cause of patient's symptoms.  Pelvic ultrasound reassuring but with evidence of possible pelvic congestion syndrome.  Given patient's known exposure to STD in the setting of vaginal discharge as well  as CMT, will treat for PID.  Given first dose of medications while in the ED and will send home with similar medications.  Will recommend abstinence from sexual intercourse and follow-up with PCP for reevaluation.  Strict return precautions discussed at length.  Treatment plan discussed with patient and she acknowledged understanding was agreeable to said plan.  Patient overall well-appearing, afebrile in no acute distress. Worrisome signs and symptoms were discussed with the patient, and the patient acknowledged understanding to return to the ED if noticed. Patient was stable upon discharge.  Final Clinical Impression(s) / ED Diagnoses Final diagnoses:  Pelvic pain  Exposure to STD    Rx / DC Orders ED Discharge Orders          Ordered    metroNIDAZOLE  (FLAGYL ) 500 MG tablet  2 times daily        10/12/23 1336    doxycycline  (VIBRAMYCIN ) 100 MG capsule  2 times daily        10/12/23 1336              [facility] [place], [place] 10/12/23 1343    Rosealee Concha, MD 10/12/23 1349

## 2023-10-12 NOTE — Discharge Instructions (Addendum)
 As discussed, your test today were positive for BV.  The ultrasound showed evidence of pelvic congestion syndrome but otherwise was unremarkable.  There is concern that you may have pelvic inflammatory disease so we will place you on 2 different antibiotics.  Please take these antibiotics in their complete duration.  Follow MyChart for the results of your STD testing.  Recommend follow-up with primary care for reassessment of your symptoms.

## 2023-10-13 LAB — RPR: RPR Ser Ql: NONREACTIVE

## 2023-10-14 LAB — GC/CHLAMYDIA PROBE AMP (~~LOC~~) NOT AT ARMC
Chlamydia: NEGATIVE
Comment: NEGATIVE
Comment: NORMAL
Neisseria Gonorrhea: NEGATIVE

## 2024-01-25 ENCOUNTER — Other Ambulatory Visit: Payer: Self-pay

## 2024-01-25 ENCOUNTER — Encounter (HOSPITAL_COMMUNITY): Payer: Self-pay | Admitting: Emergency Medicine

## 2024-01-25 ENCOUNTER — Emergency Department (HOSPITAL_COMMUNITY)
Admission: EM | Admit: 2024-01-25 | Discharge: 2024-01-25 | Disposition: A | Discharge status: Home / Self Care | Payer: Self-pay | Attending: Emergency Medicine | Admitting: Emergency Medicine

## 2024-01-25 DIAGNOSIS — J029 Acute pharyngitis, unspecified: Secondary | ICD-10-CM | POA: Insufficient documentation

## 2024-01-25 DIAGNOSIS — R0981 Nasal congestion: Secondary | ICD-10-CM | POA: Insufficient documentation

## 2024-01-25 DIAGNOSIS — F17200 Nicotine dependence, unspecified, uncomplicated: Secondary | ICD-10-CM | POA: Insufficient documentation

## 2024-01-25 DIAGNOSIS — R0982 Postnasal drip: Secondary | ICD-10-CM | POA: Insufficient documentation

## 2024-01-25 LAB — RESP PANEL BY RT-PCR (RSV, FLU A&B, COVID)  RVPGX2
Influenza A by PCR: NEGATIVE
Influenza B by PCR: NEGATIVE
Resp Syncytial Virus by PCR: NEGATIVE
SARS Coronavirus 2 by RT PCR: NEGATIVE

## 2024-01-25 LAB — GROUP A STREP BY PCR: Group A Strep by PCR: NOT DETECTED

## 2024-01-25 MED ORDER — AMOXICILLIN-POT CLAVULANATE 875-125 MG PO TABS: 1.0000 | ORAL_TABLET | Freq: Once | ORAL | Status: DC

## 2024-01-25 MED ORDER — AMOXICILLIN 500 MG PO CAPS
500.0000 mg | ORAL_CAPSULE | Freq: Once | ORAL | Status: AC
  Administered 2024-01-25: 500 mg via ORAL
  Filled 2024-01-25: qty 1

## 2024-01-25 MED ORDER — AMOXICILLIN 500 MG PO CAPS: 500.0000 mg | ORAL_CAPSULE | Freq: Three times a day (TID) | ORAL | 0 refills | Status: DC

## 2024-01-25 NOTE — ED Triage Notes (Signed)
 Patient presents due to sore throat, cough and neck pain. Nothing has helped the pain. She has had symptoms for 4 days. Denies sick contacts.

## 2024-01-25 NOTE — Discharge Instructions (Addendum)
 Take antibiotic 3 times a day for 7 days.  If symptoms worsen return to ED for further evaluations.  If symptoms do not resolve after course of antibiotic return to the primary care for further evaluation.  Continue to take Tylenol  and ibuprofen  as needed at home for pain.

## 2024-01-25 NOTE — ED Provider Notes (Signed)
 [place] [facility] AT [facility] Provider Note   CSN: [id number] Arrival date & time: 01/25/24  1806     Patient presents with: Sore Throat   Kim Harvey is a 33 y.o. female.  Patient presents to ED with complaints of sore throat x 3 days that has not resolved with over-the-counter medication.  Patient denies cough and reports she feels feverish at home has not taken her temperature.  Patient has not had any relief with over-the-counter medication.  And reports some postnasal drip and congestion in her sinuses.  Patient reports she does not have insurance and has not been able to follow-up with her primary care due to financial issues.  Patient denies any known contact with STD infection.      Prior to Admission medications   Medication Sig Start Date End Date Taking? Authorizing Provider  amoxicillin  (AMOXIL ) 500 MG capsule Take 1 capsule (500 mg total) by mouth 3 (three) times daily. 01/25/24  Yes Myriam Fonda RAMAN, PA-C  diclofenac  Sodium (VOLTAREN ) 1 % GEL Apply 2 g topically 4 (four) times daily. 09/11/20   Neldon Hamp RAMAN, PA  doxycycline  (VIBRAMYCIN ) 100 MG capsule Take 1 capsule (100 mg total) by mouth 2 (two) times daily. 10/12/23   Silver Fell A, PA  EPINEPHrine  0.3 mg/0.3 mL IJ SOAJ injection Inject 0.3 mLs (0.3 mg total) into the muscle once. Patient not taking: Reported on 07/06/2019 11/05/15   Olympia Gee, PA-C  ibuprofen  (ADVIL ) 200 MG tablet Take 800 mg by mouth every 6 (six) hours as needed for headache or moderate pain.    [provider]  Lidocaine  (HM LIDOCAINE  PATCH) 4 % PTCH Apply 1 patch topically 4 (four) times daily as needed (pain). 09/11/20   Neldon Hamp RAMAN, PA  metroNIDAZOLE  (FLAGYL ) 500 MG tablet Take 1 tablet (500 mg total) by mouth 2 (two) times daily. 10/12/23   Silver Fell LABOR, PA  phenazopyridine  (PYRIDIUM ) 200 MG tablet Take 1 tablet (200 mg total) by mouth 3 (three) times daily. 07/06/19   Randol Simmonds, MD     Allergies: Patient has no known allergies.    Review of Systems  HENT:  Positive for postnasal drip, sinus pressure and sore throat. Negative for ear discharge, ear pain, tinnitus and trouble swallowing.   All other systems reviewed and are negative.   Updated Vital Signs BP 130/77 (BP Location: Right Arm)   Pulse 73   Temp 98.7 F (37.1 C) (Oral)   Resp 16   SpO2 100%   Physical Exam Vitals and nursing note reviewed.  Constitutional:      General: She is not in acute distress.    Appearance: Normal appearance. She is not ill-appearing.  HENT:     Head: Normocephalic and atraumatic.     Right Ear: Ear canal normal. No drainage, swelling or tenderness.     Left Ear: Tympanic membrane and ear canal normal. No drainage, swelling or tenderness.     Mouth/Throat:     Pharynx: Uvula midline. Pharyngeal swelling and posterior oropharyngeal erythema present.  Eyes:     Extraocular Movements: Extraocular movements intact.     Pupils: Pupils are equal, round, and reactive to light.  Cardiovascular:     Rate and Rhythm: Normal rate.  Pulmonary:     Effort: Pulmonary effort is normal. No respiratory distress.  Abdominal:     Tenderness: There is no guarding.  Musculoskeletal:        General: Normal range of motion.  Cervical back: Normal range of motion.  Skin:    General: Skin is warm and dry.  Neurological:     General: No focal deficit present.     Mental Status: She is alert.  Psychiatric:        Mood and Affect: Mood normal.        Behavior: Behavior normal.     (all labs ordered are listed, but only abnormal results are displayed) Labs Reviewed  GROUP A STREP BY PCR  RESP PANEL BY RT-PCR (RSV, FLU A&B, COVID)  [id number]    EKG: None  Radiology: No results found.  Procedures   Medications Ordered in the ED  amoxicillin  (AMOXIL ) capsule 500 mg (500 mg Oral Given 01/25/24 2111)   33 y.o. female presents to the ED for concern of Sore Throat     This  involves an extensive number of treatment options, and is a complaint that carries with it a high risk of complications and morbidity.  The differential diagnosis prior to evaluation includes, but is not limited to: Tonsillar abscess, tonsillitis, pharyngitis, allergic rhinitis, sinus infection  This is not an exhaustive differential.   Past Medical History / Co-morbidities / Social History: Hx of headache, chlamydia, trichomonas Social Determinants of Health include: Reports tobacco use   Additional History:  Obtained by chart review.  Notably established primary care with [facility].  Follow-ups included UTIs, STD testing and routine prenatal care  Lab Tests: I ordered, and personally interpreted labs.  The pertinent results include: No acute abnormalities on lab work.   ED Course / Critical Interventions: Pt well-appearing on exam.  Sitting comfortable in ED bed.  Patient has clear ears canals and TMs normal.  Patient has lymphadenopathy and erythema in her tonsils.  No noted exudates but patient reports some postnasal drip and use of allergy medicine and Tylenol /ibuprofen  at home.  Patient has a Centor score of 3.  There is no obvious signs of unilateral swelling and tonsillar abscess is not suspected.  Patient reports no known contacts with STDs.  Lungs are clear to auscultation all fields and patient denies any nausea, vomiting, abdominal pain, dizziness, chest pain.  She was advised to patient to continue to manage at home with over-the-counter medication and amoxicillin  will be prescribed.  Patient was advised of symptoms to monitor for return to ED for further evaluation.  Patient was advised if symptoms do not resolve after course of antibiotics to seek medical attention for reevaluation.  Patient agreed with treatment plan and was comfortable discharge.  I have reviewed the patients home medicines and have made adjustments as needed.  Disposition: Considered admission and after  reviewing the patient's encounter today, I feel that the patient would benefit from discharge and outpatient management.  Discussed course of treatment with the patient, whom demonstrated understanding.  Patient in agreement and has no further questions.    I discussed this case with my attending, Dr. Darra, who agreed with the proposed treatment course and cosigned this note including patient's presenting symptoms, physical exam, and planned diagnostics and interventions.  Attending physician stated agreement with plan or made changes to plan which were implemented.     This chart was dictated using voice recognition software.  Despite best efforts to proofread, errors can occur which can change the documentation meaning.    Final diagnoses:  Sore throat    ED Discharge Orders          Ordered    amoxicillin  (AMOXIL ) 500 MG capsule  3 times daily        01/25/24 2048               Myriam Fonda GORMAN DEVONNA 01/26/24 1438    Darra Fonda MATSU, MD 01/29/24 [phone]

## 2024-03-21 ENCOUNTER — Encounter (HOSPITAL_COMMUNITY): Payer: Self-pay

## 2024-03-21 ENCOUNTER — Ambulatory Visit (HOSPITAL_COMMUNITY)
Admission: EM | Admit: 2024-03-21 | Discharge: 2024-03-21 | Disposition: A | Discharge status: Home / Self Care | Payer: Self-pay | Attending: Emergency Medicine | Admitting: Emergency Medicine

## 2024-03-21 DIAGNOSIS — R3915 Urgency of urination: Secondary | ICD-10-CM | POA: Insufficient documentation

## 2024-03-21 DIAGNOSIS — R300 Dysuria: Secondary | ICD-10-CM | POA: Insufficient documentation

## 2024-03-21 DIAGNOSIS — N3001 Acute cystitis with hematuria: Secondary | ICD-10-CM | POA: Insufficient documentation

## 2024-03-21 LAB — POCT URINE PREGNANCY: Preg Test, Ur: NEGATIVE

## 2024-03-21 LAB — POCT URINALYSIS DIP (MANUAL ENTRY)
Bilirubin, UA: NEGATIVE
Glucose, UA: NEGATIVE mg/dL
Ketones, POC UA: NEGATIVE mg/dL
Nitrite, UA: NEGATIVE
Protein Ur, POC: NEGATIVE mg/dL
Spec Grav, UA: 1.015 (ref 1.010–1.025)
Urobilinogen, UA: 0.2 U/dL
pH, UA: 7 (ref 5.0–8.0)

## 2024-03-21 MED ORDER — PHENAZOPYRIDINE HCL 200 MG PO TABS: 200.0000 mg | ORAL_TABLET | Freq: Three times a day (TID) | ORAL | 0 refills | Status: AC

## 2024-03-21 MED ORDER — SULFAMETHOXAZOLE-TRIMETHOPRIM 800-160 MG PO TABS: 1.0000 | ORAL_TABLET | Freq: Two times a day (BID) | ORAL | 0 refills | Status: AC

## 2024-03-21 NOTE — Discharge Instructions (Signed)
 Start taking Bactrim  twice daily for 3 days for urinary tract infection. You can take Pyridium  every 8 hours as needed for urinary pain and urgency. Your urine culture results will return over the next few days and someone will call if results require any adjustment in treatment. Otherwise follow-up with your primary care provider or return here as needed.

## 2024-03-21 NOTE — ED Triage Notes (Signed)
 Pt c/o burning/tingling on urination with frequency and bladder pressure. Denies taking any meds.

## 2024-03-21 NOTE — ED Provider Notes (Signed)
 [facility]    CSN: [id number] Arrival date & time: 03/21/24  1127      History   Chief Complaint Chief Complaint  Patient presents with   Urinary Tract Infection    HPI Kim Harvey is a 33 y.o. female.   Patient presents with dysuria, urinary frequency, urinary urgency, and bladder pressure that began about 2 days ago.  Patient states her symptoms have progressively worsened over the last few days.  Patient denies hematuria, abdominal pain, flank pain, nausea, vomiting, fever, body aches, chills, vaginal discharge, vaginal pain, abnormal vaginal bleeding, and vaginal lesions  The history is provided by the patient and medical records.  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 Authorizing Provider  phenazopyridine  (PYRIDIUM ) 200 MG tablet Take 1 tablet (200 mg total) by mouth 3 (three) times daily. 03/21/24  Yes Johnie, Karmello Abercrombie A, NP  sulfamethoxazole -trimethoprim  (BACTRIM  DS) 800-160 MG tablet Take 1 tablet by mouth 2 (two) times daily for 3 days. 03/21/24 03/24/24 Yes Deniz Eskridge A, NP  EPINEPHrine  0.3 mg/0.3 mL IJ SOAJ injection Inject 0.3 mLs (0.3 mg total) into the muscle once. Patient not taking: Reported on 07/06/2019 11/05/15   Olympia Gee, PA-C  ibuprofen  (ADVIL ) 200 MG tablet Take 800 mg by mouth every 6 (six) hours as needed for headache or moderate  pain.    [provider]    Family History Family History  Problem Relation Age of Onset   Asthma Mother    Hypertension Mother    Asthma Father    Heart disease Father    Hypertension Father    Hyperlipidemia Father    Vision loss Father    Anesthesia problems Neg Hx    Hypotension Neg Hx    Malignant hyperthermia Neg Hx    Pseudochol deficiency Neg Hx    Other Neg Hx     Social History Social History   Tobacco Use   Smoking status: Every Day    Current packs/day: 1.00    Average packs/day: 1 pack/day for 6.0 years (6.0 ttl pk-yrs)    Types: Cigarettes   Smokeless tobacco: Never  Vaping Use   Vaping status: Never Used  Substance Use Topics   Alcohol use: No   Drug use: Not Currently     Allergies   Patient has no known allergies.   Review of Systems Review of Systems  Per HPI  Physical Exam Triage Vital Signs ED Triage Vitals  Encounter Vitals Group     BP 03/21/24 1146 111/73     Girls Systolic BP  Percentile --      Girls Diastolic BP Percentile --      Boys Systolic BP Percentile --      Boys Diastolic BP Percentile --      Pulse Rate 03/21/24 1146 76     Resp 03/21/24 1146 16     Temp 03/21/24 1146 98.3 F (36.8 C)     Temp Source 03/21/24 1146 Oral     SpO2 03/21/24 1146 98 %     Weight --      Height --      Head Circumference --      Peak Flow --      Pain Score 03/21/24 1144 8     Pain Loc --      Pain Education --      Exclude from Growth Chart --    No data found.  Updated Vital Signs BP 111/73 (BP Location: Right Arm)   Pulse 76   Temp 98.3 F (36.8 C) (Oral)   Resp 16   LMP 03/03/2024 (Exact Date)   SpO2 98%   Visual Acuity Right Eye Distance:   Left Eye Distance:   Bilateral Distance:    Right Eye Near:   Left Eye Near:    Bilateral Near:     Physical Exam Vitals and nursing note reviewed.  Constitutional:      General: She is awake. She is not in acute distress.    Appearance: Normal appearance. She  is well-developed and well-groomed. She is not ill-appearing.  Abdominal:     General: Abdomen is flat. Bowel sounds are normal. There is no distension.     Palpations: Abdomen is soft.     Tenderness: There is no abdominal tenderness. There is no right CVA tenderness, left CVA tenderness, guarding or rebound.  Skin:    General: Skin is warm and dry.  Neurological:     Mental Status: She is alert.  Psychiatric:        Behavior: Behavior is cooperative.      UC Treatments / Results  Labs (all labs ordered are listed, but only abnormal results are displayed) Labs Reviewed  POCT URINALYSIS DIP (MANUAL ENTRY) - Abnormal; Notable for the following components:      Result Value   Clarity, UA turbid (*)    Blood, UA moderate (*)    Leukocytes, UA Large (3+) (*)    All other components within normal limits  URINE CULTURE  POCT URINE PREGNANCY    EKG   Radiology No results found.  Procedures Procedures (including critical care time)  Medications Ordered in UC Medications - No data to display  Initial Impression / Assessment and Plan / UC Course  I have reviewed the triage vital signs and the nursing notes.  Pertinent labs & imaging results that were available during my care of the patient were reviewed by me and considered in my medical decision making (see chart for details).     Patient is overall well-appearing.  Vitals are stable.  Is flat, soft, and nontender.  Without CVA tenderness.  Urinalysis reveals large leukocytes and moderate RBCs, will send urine culture.  UPT negative.  Empirically treated with Bactrim  for acute cystitis.  Prescribed Pyridium  as needed for urinary symptoms.  Discussed follow-up and return precautions. Final Clinical Impressions(s) / UC Diagnoses   Final diagnoses:  Dysuria  Urinary urgency  Acute cystitis with hematuria     Discharge Instructions      Start taking Bactrim  twice daily  for 3 days for urinary tract infection. You can  take Pyridium  every 8 hours as needed for urinary pain and urgency. Your urine culture results will return over the next few days and someone will call if results require any adjustment in treatment. Otherwise follow-up with your primary care provider or return here as needed.   ED Prescriptions     Medication Sig Dispense Auth. Provider   sulfamethoxazole -trimethoprim  (BACTRIM  DS) 800-160 MG tablet Take 1 tablet by mouth 2 (two) times daily for 3 days. 6 tablet Johnie Flaming A, NP   phenazopyridine  (PYRIDIUM ) 200 MG tablet Take 1 tablet (200 mg total) by mouth 3 (three) times daily. 6 tablet Johnie Flaming A, NP      PDMP not reviewed this encounter.   Johnie Flaming A, NP 03/21/24 1213

## 2024-03-23 ENCOUNTER — Ambulatory Visit (HOSPITAL_COMMUNITY): Payer: Self-pay

## 2024-03-23 LAB — URINE CULTURE: Culture: >=100000 — AB

## 2024-05-20 ENCOUNTER — Encounter (HOSPITAL_BASED_OUTPATIENT_CLINIC_OR_DEPARTMENT_OTHER): Payer: Self-pay | Admitting: Emergency Medicine

## 2024-05-20 ENCOUNTER — Emergency Department (HOSPITAL_BASED_OUTPATIENT_CLINIC_OR_DEPARTMENT_OTHER)
Admission: EM | Admit: 2024-05-20 | Discharge: 2024-05-20 | Discharge status: Against Medical Advice | Attending: Emergency Medicine | Admitting: Emergency Medicine

## 2024-05-20 DIAGNOSIS — A539 Syphilis, unspecified: Secondary | ICD-10-CM | POA: Diagnosis not present

## 2024-05-20 DIAGNOSIS — R21 Rash and other nonspecific skin eruption: Secondary | ICD-10-CM | POA: Diagnosis present

## 2024-05-20 HISTORY — DX: Syphilis, unspecified: A53.9

## 2024-05-20 LAB — PREGNANCY, URINE: Preg Test, Ur: NEGATIVE

## 2024-05-20 MED ORDER — DOXYCYCLINE HYCLATE 100 MG PO CAPS: 100.0000 mg | ORAL_CAPSULE | Freq: Two times a day (BID) | ORAL | 0 refills | Status: AC

## 2024-05-20 MED ORDER — PENICILLIN G BENZATHINE 1200000 UNIT/2ML IM SUSY: 2.4000 10*6.[IU] | PREFILLED_SYRINGE | Freq: Once | INTRAMUSCULAR | Status: DC

## 2024-05-20 NOTE — ED Provider Notes (Signed)
 " [place] [facility] Provider Note   CSN: [id number] Arrival date & time: 05/20/24  1032     Patient presents with: SEXUALLY TRANSMITTED DISEASE   Kim Harvey is a 33 y.o. female.   Patient is a 33 year old female who presents with a positive syphilis test on December 18.  She is here to receive penicillin  shot.  She denies any symptoms other than she intermittently has a rash on her abdomen.  No rash on her hands or feet.  No prior history of syphilis.       Prior to Admission medications  Medication Sig Start Date End Date Taking? Authorizing Provider  doxycycline  (VIBRAMYCIN ) 100 MG capsule Take 1 capsule (100 mg total) by mouth 2 (two) times daily for 14 days. One po bid x 7 days 05/20/24 06/03/24 Yes Lenor Hollering, MD  EPINEPHrine  0.3 mg/0.3 mL IJ SOAJ injection Inject 0.3 mLs (0.3 mg total) into the muscle once. Patient not taking: Reported on 07/06/2019 11/05/15   Olympia Gee, PA-C  ibuprofen  (ADVIL ) 200 MG tablet Take 800 mg by mouth every 6 (six) hours as needed for headache or moderate pain.    [provider]  phenazopyridine  (PYRIDIUM ) 200 MG tablet Take 1 tablet (200 mg total) by mouth 3 (three) times daily. 03/21/24   Johnie Rumaldo LABOR, NP    Allergies: Patient has no known allergies.    Review of Systems  Constitutional:  Negative for fatigue and fever.  HENT: Negative.    Respiratory:  Negative for shortness of breath.   Gastrointestinal:  Negative for abdominal pain, nausea and vomiting.  Musculoskeletal:  Negative for arthralgias.  Skin:  Positive for rash.  Neurological:  Negative for headaches.    Updated Vital Signs BP 123/64 (BP Location: Right Arm)   Pulse 85   Temp 98.1 F (36.7 C) (Oral)   Resp 19   Ht 5' 5 (1.651 m)   Wt 59 kg   LMP 05/01/2024   SpO2 100%   BMI 21.63 kg/m   Physical Exam Constitutional:      Appearance: She is well-developed.  HENT:     Head: Normocephalic and  atraumatic.  Eyes:     Pupils: Pupils are equal, round, and reactive to light.  Cardiovascular:     Rate and Rhythm: Normal rate and regular rhythm.     Heart sounds: Normal heart sounds.  Pulmonary:     Effort: Pulmonary effort is normal. No respiratory distress.     Breath sounds: Normal breath sounds. No wheezing or rales.  Chest:     Chest wall: No tenderness.  Abdominal:     General: Bowel sounds are normal.     Palpations: Abdomen is soft.     Tenderness: There is no abdominal tenderness. There is no guarding or rebound.  Musculoskeletal:        General: Normal range of motion.     Cervical back: Normal range of motion and neck supple.  Lymphadenopathy:     Cervical: No cervical adenopathy.  Skin:    General: Skin is warm and dry.     Findings: No rash.     Comments: Faint flat erythematous spots on her trunk.  They are nonblanching.  No rash on her palms.  No petechiae or purpura.  Neurological:     Mental Status: She is alert and oriented to person, place, and time.     (all labs ordered are listed, but only abnormal results are displayed)  Labs Reviewed  PREGNANCY, URINE  T.PALLIDUM AB, TOTAL    EKG: None  Radiology: No results found.   Procedures   Medications Ordered in the ED - No data to display                                  Medical Decision Making Amount and/or Complexity of Data Reviewed Labs: ordered.  Risk Prescription drug management.   Patient presents with positive syphilis test.  I initially did not see a confirmatory antibody test so this was ordered.  However it appears that she may have had one with the [facility] system.  I initially ordered Bicillin .  However the pharmacist has indicated that this is restricted to use in pregnant patients with syphilis or congenital syphilis.  Her pregnancy test is negative.  He recommends starting the patient on doxycycline .  On having this discussion with the patient, she became very agitated.  She  was angry that she could not get the Bicillin  and was concerned that the doxycycline  would not be effective.  I did advise her that doxycycline  is highly effective in treating syphilis but she would have to follow-up to make sure that she has adequate treatment.  She still was quite upset and ended up leaving prior to getting discharge papers.  However I did send her doxycycline  prescription into the pharmacy that was requested by her mom.     Final diagnoses:  Syphilis    ED Discharge Orders          Ordered    doxycycline  (VIBRAMYCIN ) 100 MG capsule  2 times daily        05/20/24 1317               Lenor Hollering, MD 05/20/24 1323  "

## 2024-05-20 NOTE — ED Triage Notes (Signed)
 Pt was notified of positive syphilis test (12/18); she was instructed to go to [facility] to receive [facility] antibiotic, but they did not have it so they referred her here

## 2024-05-20 NOTE — Discharge Instructions (Signed)
 Take the antibiotics as discussed twice a day for 14 days.  Follow-up with your primary care doctor.  Return to the emergency room if you have any worsening symptoms.

## 2024-05-20 NOTE — ED Notes (Signed)
 Pt left prior to discharge. EDP and charge aware.

## 2024-06-07 ENCOUNTER — Telehealth (HOSPITAL_BASED_OUTPATIENT_CLINIC_OR_DEPARTMENT_OTHER): Payer: Self-pay

## 2024-06-07 NOTE — Telephone Encounter (Signed)
 Pt called this nurse back is is aware of findings and will follow up with her PCP

## 2024-06-07 NOTE — Telephone Encounter (Signed)
 Called pt to inform her that her test results from [facility] were not completed due to the sample provided being insufficient. Spoke with EDP and recommends that pt follow up with PCP or health department for follow up symptoms if she is still having issues. Called pt but pt did not answer. Left pt a voicemail to call staff back.
